# Patient Record
Sex: Female | Born: 1995 | Race: Black or African American | Hispanic: No | Marital: Single | Smoking: Never smoker
Health system: Southern US, Community
[De-identification: ages and names within clinical notes are randomized; demographics above are authoritative.]

## PROBLEM LIST (undated history)

## (undated) ENCOUNTER — Inpatient Hospital Stay (HOSPITAL_COMMUNITY): Payer: Self-pay

## (undated) DIAGNOSIS — R569 Unspecified convulsions: Secondary | ICD-10-CM

## (undated) HISTORY — PX: NO PAST SURGERIES: SHX2092

## (undated) HISTORY — DX: Unspecified convulsions: R56.9

---
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
## 2018-03-19 ENCOUNTER — Encounter

## 2018-05-24 ENCOUNTER — Other Ambulatory Visit: Payer: Self-pay

## 2018-05-24 ENCOUNTER — Encounter (HOSPITAL_COMMUNITY): Payer: Self-pay | Admitting: *Deleted

## 2018-05-24 ENCOUNTER — Inpatient Hospital Stay (HOSPITAL_COMMUNITY)
Admission: AD | Admit: 2018-05-24 | Discharge: 2018-05-24 | Disposition: A | Discharge status: Home / Self Care | Payer: Medicaid Other | Source: Ambulatory Visit | Attending: Obstetrics and Gynecology | Admitting: Obstetrics and Gynecology

## 2018-05-24 ENCOUNTER — Inpatient Hospital Stay (HOSPITAL_BASED_OUTPATIENT_CLINIC_OR_DEPARTMENT_OTHER): Payer: Medicaid Other

## 2018-05-24 DIAGNOSIS — O4402 Placenta previa specified as without hemorrhage, second trimester: Secondary | ICD-10-CM

## 2018-05-24 DIAGNOSIS — Z3686 Encounter for antenatal screening for cervical length: Secondary | ICD-10-CM | POA: Diagnosis not present

## 2018-05-24 DIAGNOSIS — O26892 Other specified pregnancy related conditions, second trimester: Secondary | ICD-10-CM | POA: Insufficient documentation

## 2018-05-24 DIAGNOSIS — Z3A19 19 weeks gestation of pregnancy: Secondary | ICD-10-CM

## 2018-05-24 DIAGNOSIS — O26872 Cervical shortening, second trimester: Secondary | ICD-10-CM

## 2018-05-24 LAB — OB RESULTS CONSOLE HIV ANTIBODY (ROUTINE TESTING): HIV: NONREACTIVE

## 2018-05-24 LAB — OB RESULTS CONSOLE GC/CHLAMYDIA
Chlamydia: NEGATIVE
Gonorrhea: NEGATIVE

## 2018-05-24 LAB — OB RESULTS CONSOLE ABO/RH: RH Type: POSITIVE

## 2018-05-24 LAB — OB RESULTS CONSOLE RPR: RPR: NONREACTIVE

## 2018-05-24 LAB — OB RESULTS CONSOLE ANTIBODY SCREEN: ANTIBODY SCREEN: NEGATIVE

## 2018-05-24 LAB — OB RESULTS CONSOLE RUBELLA ANTIBODY, IGM: Rubella: IMMUNE

## 2018-05-24 LAB — OB RESULTS CONSOLE HEPATITIS B SURFACE ANTIGEN: Hepatitis B Surface Ag: NEGATIVE

## 2018-05-24 NOTE — Discharge Instructions (Signed)
Second Trimester of Pregnancy The second trimester is from week 13 through week 28, month 4 through 6. This is often the time in pregnancy that you feel your best. Often times, morning sickness has lessened or quit. You may have more energy, and you may get hungry more often. Your unborn baby (fetus) is growing rapidly. At the end of the sixth month, he or she is about 9 inches long and weighs about 1 pounds. You will likely feel the baby move (quickening) between 18 and 20 weeks of pregnancy. Follow these instructions at home:  Avoid all smoking, herbs, and alcohol. Avoid drugs not approved by your doctor.  Do not use any tobacco products, including cigarettes, chewing tobacco, and electronic cigarettes. If you need help quitting, ask your doctor. You may get counseling or other support to help you quit.  Only take medicine as told by your doctor. Some medicines are safe and some are not during pregnancy.  Exercise only as told by your doctor. Stop exercising if you start having cramps.  Eat regular, healthy meals.  Wear a good support bra if your breasts are tender.  Do not use hot tubs, steam rooms, or saunas.  Wear your seat belt when driving.  Avoid raw meat, uncooked cheese, and liter boxes and soil used by cats.  Take your prenatal vitamins.  Take 1500-2000 milligrams of calcium daily starting at the 20th week of pregnancy until you deliver your baby.  Try taking medicine that helps you poop (stool softener) as needed, and if your doctor approves. Eat more fiber by eating fresh fruit, vegetables, and whole grains. Drink enough fluids to keep your pee (urine) clear or pale yellow.  Take warm water baths (sitz baths) to soothe pain or discomfort caused by hemorrhoids. Use hemorrhoid cream if your doctor approves.  If you have puffy, bulging veins (varicose veins), wear support hose. Raise (elevate) your feet for 15 minutes, 3-4 times a day. Limit salt in your diet.  Avoid heavy  lifting, wear low heals, and sit up straight.  Rest with your legs raised if you have leg cramps or low back pain.  Visit your dentist if you have not gone during your pregnancy. Use a soft toothbrush to brush your teeth. Be gentle when you floss.  You can have sex (intercourse) unless your doctor tells you not to.  Go to your doctor visits. Get help if:  You feel dizzy.  You have mild cramps or pressure in your lower belly (abdomen).  You have a nagging pain in your belly area.  You continue to feel sick to your stomach (nauseous), throw up (vomit), or have watery poop (diarrhea).  You have bad smelling fluid coming from your vagina.  You have pain with peeing (urination). Get help right away if:  You have a fever.  You are leaking fluid from your vagina.  You have spotting or bleeding from your vagina.  You have severe belly cramping or pain.  You lose or gain weight rapidly.  You have trouble catching your breath and have chest pain.  You notice sudden or extreme puffiness (swelling) of your face, hands, ankles, feet, or legs.  You have not felt the baby move in over an hour.  You have severe headaches that do not go away with medicine.  You have vision changes. This information is not intended to replace advice given to you by your health care provider. Make sure you discuss any questions you have with your health care   provider. Document Released: 11/12/2009 Document Revised: 01/24/2016 Document Reviewed: 10/19/2012 Elsevier Interactive Patient Education  2017 Elsevier Inc.  

## 2018-05-24 NOTE — MAU Note (Signed)
Urine in lab 

## 2018-05-24 NOTE — MAU Note (Signed)
Initial prenatal appt today.  Pt denies pain, feels some pressure.  Denies bleeding or leaking, was told they saw some pink d/c on exam.

## 2018-05-24 NOTE — Progress Notes (Signed)
[facility] to pt arrival.  On US today, cervical funneling was noted and dx with a complete previa.  Pt sent in for further eval.

## 2018-05-24 NOTE — MAU Provider Note (Signed)
  History     CSN: [id number]  Arrival date and time: 05/24/18 1112  Chief Complaint  Patient presents with  . further eval   G1 @19 .4 wks [facility] for possible cervical shortening and funneling. Her pregnancy is also complicated by placenta previa. Denies VB or pain. Has not felt FM yet.  OB History    Gravida  1   Para      Term      Preterm      AB      Living        SAB      TAB      Ectopic      Multiple      Live Births              Past Medical History:  Diagnosis Date  . Medical history non-contributory     Past Surgical History:  Procedure Laterality Date  . NO PAST SURGERIES      No family history on file.  Social History   Tobacco Use  . Smoking status: Never Smoker  . Smokeless tobacco: Never Used  Substance Use Topics  . Alcohol use: No  . Drug use: Never    Allergies: No Known Allergies  Medications Prior to Admission  Medication Sig Dispense Refill Last Dose  . escitalopram (LEXAPRO) 10 MG tablet Take 1 tablet (10 mg total) by mouth daily. 30 tablet 0     Review of Systems  Gastrointestinal: Negative for abdominal pain.  Genitourinary: Negative for vaginal bleeding.   Physical Exam   Blood pressure 109/60, pulse 97, temperature 99 F (37.2 C), temperature source Oral, resp. rate 16.  Physical Exam  Constitutional: She is oriented to person, place, and time. She appears well-developed and well-nourished. No distress.  HENT:  Head: Normocephalic.  Neck: Normal range of motion.  Respiratory: Effort normal. No respiratory distress.  Musculoskeletal: Normal range of motion.  Neurological: She is alert and oriented to person, place, and time.  Psychiatric: She has a normal mood and affect.  FHT 152  MAU Course  Procedures  MDM US ordered and reviewed. Normal placenta and cervical length. Consult with Dr. Earlene Plateravis, images reviewed. Results discussed with pt and partner. Stable for discharge  home.  Assessment and Plan  [redacted] weeks gestation Discharge home [facility] Return precautions  Allergies as of 05/24/2018   No Known Allergies     Medication List    TAKE these medications   escitalopram 10 MG tablet Commonly known as:  LEXAPRO Take 1 tablet (10 mg total) by mouth daily.      Donette LarryMelanie Naylee Frankowski, CNM 05/24/2018, 1:17 PM

## 2018-07-26 ENCOUNTER — Ambulatory Visit (INDEPENDENT_AMBULATORY_CARE_PROVIDER_SITE_OTHER): Payer: Medicaid Other | Admitting: Neurology

## 2018-07-26 ENCOUNTER — Encounter: Payer: Self-pay | Admitting: Neurology

## 2018-07-26 VITALS — BP 115/75 | HR 105 | Ht 61.0 in | Wt 133.0 lb

## 2018-07-26 DIAGNOSIS — R569 Unspecified convulsions: Secondary | ICD-10-CM | POA: Diagnosis not present

## 2018-07-26 NOTE — Progress Notes (Signed)
PATIENT: Jenny Chan DOB: 1996-07-31  Chief Complaint  Patient presents with  . Rule out seizure    She is here with her significant other, Jenny Chan.  She is currently [redacted] weeks pregnant with her first child.  Reports two witnessed events of passing out with body tremors.  Says she was disoriented after both episodes.  Jenny Chan, Jenny Chan (no PCP)     HISTORICAL  Jenny Chan is a 22 year old female, seen in request by her primary care physician Dr. Shawnie Pons, Kenney Houseman for evaluation of seizure, initial evaluation was on July 26, 2018.  She is accompanied by her significant others for the month.  She is currently [redacted] weeks pregnant.  Due date will be on October 14, 2018  I have reviewed and summarized the referring note from the referring physician.  She denies a previous history of seizure, presented with 2 seizure-like event, the first episode was in 2018, the second episode was in September 2019, both episode happened in a similar scenario, she was in a standing position, was in a hot argument, she felt so angry, fell to the ground, no loss of consciousness, but her body began to shake, she described it as pelvic on and off the floor trushing movement, she is aware of the surrounding, but has difficulty talking, lasting for few minutes, followed by mild to moderate bilateral frontal headaches.   REVIEW OF SYSTEMS: Full 14 system review of systems performed and notable only for as above All other review of systems were negative.  ALLERGIES: No Known Allergies  HOME MEDICATIONS: Current Outpatient Medications  Medication Sig Dispense Refill  . cetirizine (ZYRTEC) 10 MG tablet Take 10 mg by mouth daily.    . Prenatal Vit-Fe Fumarate-FA (PREPLUS) 27-1 MG TABS Take 1 tablet by mouth daily.  11   No current facility-administered medications for this visit.     PAST MEDICAL HISTORY: Past Medical History:  Diagnosis Date  . Seizure-like activity (HCC)     PAST  SURGICAL HISTORY: Past Surgical History:  Procedure Laterality Date  . NO PAST SURGERIES      FAMILY HISTORY: Family History  Problem Relation Age of Onset  . Healthy Mother   . Healthy Father     SOCIAL HISTORY: Social History   Socioeconomic History  . Marital status: Single    Spouse name: Not on file  . Number of children: 0  . Years of education: 46  . Highest education level: High school graduate  Occupational History  . Occupation: unemployed  Social Needs  . Financial resource strain: Not on file  . Food insecurity:    Worry: Not on file    Inability: Not on file  . Transportation needs:    Medical: Not on file    Non-medical: Not on file  Tobacco Use  . Smoking status: Never Smoker  . Smokeless tobacco: Never Used  Substance and Sexual Activity  . Alcohol use: No  . Drug use: Never  . Sexual activity: Yes    Birth control/protection: None  Lifestyle  . Physical activity:    Days per week: Not on file    Minutes per session: Not on file  . Stress: Not on file  Relationships  . Social connections:    Talks on phone: Not on file    Gets together: Not on file    Attends religious service: Not on file    Active member of club or organization: Not on file  Attends meetings of clubs or organizations: Not on file    Relationship status: Not on file  . Intimate partner violence:    Fear of current or ex partner: Not on file    Emotionally abused: Not on file    Physically abused: Not on file    Forced sexual activity: Not on file  Other Topics Concern  . Not on file  Social History Narrative   Right-handed.   Occasional caffeine use.   Lives at home with her mother.     PHYSICAL EXAM   Vitals:   07/26/18 1500  BP: 115/75  Pulse: (!) 105  Weight: 133 lb (60.3 kg)  Height: 5\' 1"  (1.549 m)    Not recorded      Body mass index is 25.13 kg/m.  PHYSICAL EXAMNIATION:  Gen: NAD, conversant, well nourised, obese, well groomed                      Cardiovascular: Regular rate rhythm, no peripheral edema, warm, nontender. Eyes: Conjunctivae clear without exudates or hemorrhage Neck: Supple, no carotid bruits. Pulmonary: Clear to auscultation bilaterally   NEUROLOGICAL EXAM:  MENTAL STATUS: Speech:    Speech is normal; fluent and spontaneous with normal comprehension.  Cognition:     Orientation to time, place and person     Normal recent and remote memory     Normal Attention span and concentration     Normal Language, naming, repeating,spontaneous speech     Fund of knowledge   CRANIAL NERVES: CN II: Visual fields are full to confrontation. Fundoscopic exam is normal with sharp discs and no vascular changes. Pupils are round equal and briskly reactive to light. CN III, IV, VI: extraocular movement are normal. No ptosis. CN V: Facial sensation is intact to pinprick in all 3 divisions bilaterally. Corneal responses are intact.  CN VII: Face is symmetric with normal eye closure and smile. CN VIII: Hearing is normal to rubbing fingers CN IX, X: Palate elevates symmetrically. Phonation is normal. CN XI: Head turning and shoulder shrug are intact CN XII: Tongue is midline with normal movements and no atrophy.  MOTOR: There is no pronator drift of out-stretched arms. Muscle bulk and tone are normal. Muscle strength is normal.  REFLEXES: Reflexes are 2+ and symmetric at the biceps, triceps, knees, and ankles. Plantar responses are flexor.  SENSORY: Intact to light touch, pinprick, positional sensation and vibratory sensation are intact in fingers and toes.  COORDINATION: Rapid alternating movements and fine finger movements are intact. There is no dysmetria on finger-to-nose and heel-knee-shin.    GAIT/STANCE: Posture is normal. Gait is steady with normal steps, base, arm swing, and turning. Heel and toe walking are normal. Tandem gait is normal.  Romberg is absent.   DIAGNOSTIC DATA (LABS, IMAGING, TESTING) - I  reviewed patient records, labs, notes, testing and imaging myself where available.   ASSESSMENT AND PLAN  Jenny Chan is a 22 y.o. female   Body shaking without loss of consciousness  The described the body movement is not consistent with seizure activity  Will complete evaluation was EEG  She is currently [redacted] weeks pregnant, due date is on October 14, 2018, she is to call clinic for any recurrent spells, we will hold off imaging study at this point.    Jenny FeinsteinYijun Dieter Chan, M.D. Ph.D.  [facility] [address] [place], [place] [address] Ph: [phone] Fax: [phone]  CC: Reva BoresPratt, Tanya S, Jenny Chan

## 2018-08-26 ENCOUNTER — Other Ambulatory Visit: Payer: Medicaid Other

## 2018-09-01 NOTE — L&D Delivery Note (Signed)
Patient: Jenny Chan MRN: [id number]  GBS status: positive, IAP given: PCN  Patient is a 23 y.o. now G1P1 s/p NSVD at [redacted]w[redacted]d, who was admitted for SOL. AROM 10h 6m prior to delivery with clear fluid.    Delivery Note At 11:10 PM a viable female was delivered via Vaginal, Spontaneous (Presentation: cephalic;  LOA).  APGAR: 8, 9; weight: pending (appears AGA) .   Placenta status: intact, 3-vessel cord.  Cord:  with the following complications: none.    Anesthesia: epidural  Episiotomy: None Lacerations: 2nd degree;Sulcus Suture Repair: 2.0 vicryl Est. Blood Loss (mL): 241  Mom to postpartum.  Baby to Couplet care / Skin to Skin.  Gwenevere Abbot 10/16/2018, 11:31 PM

## 2018-09-21 ENCOUNTER — Ambulatory Visit: Payer: Medicaid Other | Admitting: Neurology

## 2018-09-21 DIAGNOSIS — R569 Unspecified convulsions: Secondary | ICD-10-CM | POA: Diagnosis not present

## 2018-09-21 LAB — OB RESULTS CONSOLE GBS: GBS: POSITIVE

## 2018-09-21 LAB — OB RESULTS CONSOLE GC/CHLAMYDIA
Chlamydia: NEGATIVE
Gonorrhea: NEGATIVE

## 2018-09-24 NOTE — Procedures (Signed)
   HISTORY: 23 years old female, [redacted] weeks pregnant, presented with 2 witnessed episode of passing out with body tremors  TECHNIQUE:  This is a routine 16 channel EEG recording with one channel devoted to a limited EKG recording.  It was performed during wakefulness, drowsiness and asleep.  Hyperventilation and photic stimulation were performed as activating procedures.  There are minimum muscle and movement artifact noted.  Upon maximum arousal, posterior dominant waking rhythm consistent of rhythmic alpha range activity, with frequency of 10 hz. Activities are symmetric over the bilateral posterior derivations and attenuated with eye opening.  Hyperventilation produced mild/moderate buildup with higher amplitude and the slower activities noted.  Photic stimulation did not alter the tracing.  During EEG recording, patient developed drowsiness and entered stage II sleep, sleep EEG demonstrated architecture, there were frontal centrally dominant vertex waves and symmetric sleep spindles noted.  During EEG recording, there was no epileptiform discharge noted.  EKG demonstrate sinus rhythm, with heart rate of 90 bpm  CONCLUSION: This is a  normal awake and sleep EEG.  There is no electrodiagnostic evidence of epileptiform discharge.  Levert Feinstein, M.D. Ph.D.  [facility] [address] [place], [place] [address] Phone: [phone] Fax:      [phone]

## 2018-10-15 ENCOUNTER — Other Ambulatory Visit: Payer: Self-pay | Admitting: Advanced Practice Midwife

## 2018-10-15 ENCOUNTER — Telehealth (HOSPITAL_COMMUNITY): Payer: Self-pay | Admitting: *Deleted

## 2018-10-15 NOTE — Telephone Encounter (Signed)
Preadmission screen  

## 2018-10-16 ENCOUNTER — Other Ambulatory Visit: Payer: Self-pay

## 2018-10-16 ENCOUNTER — Inpatient Hospital Stay (HOSPITAL_COMMUNITY)
Admission: AD | Admit: 2018-10-16 | Discharge: 2018-10-18 | DRG: 807 | Disposition: A | Discharge status: Home / Self Care | Payer: Medicaid Other | Attending: Obstetrics and Gynecology | Admitting: Obstetrics and Gynecology

## 2018-10-16 ENCOUNTER — Encounter (HOSPITAL_COMMUNITY): Payer: Self-pay | Admitting: *Deleted

## 2018-10-16 ENCOUNTER — Encounter (HOSPITAL_COMMUNITY): Payer: Self-pay

## 2018-10-16 ENCOUNTER — Inpatient Hospital Stay (HOSPITAL_COMMUNITY)
Admission: AD | Admit: 2018-10-16 | Discharge: 2018-10-16 | Disposition: A | Discharge status: Home / Self Care | Payer: Medicaid Other | Source: Home / Self Care | Attending: Obstetrics and Gynecology | Admitting: Obstetrics and Gynecology

## 2018-10-16 ENCOUNTER — Inpatient Hospital Stay (HOSPITAL_COMMUNITY): Payer: Medicaid Other | Admitting: Anesthesiology

## 2018-10-16 DIAGNOSIS — O99824 Streptococcus B carrier state complicating childbirth: Secondary | ICD-10-CM | POA: Diagnosis present

## 2018-10-16 DIAGNOSIS — O471 False labor at or after 37 completed weeks of gestation: Secondary | ICD-10-CM

## 2018-10-16 DIAGNOSIS — Z3A40 40 weeks gestation of pregnancy: Secondary | ICD-10-CM

## 2018-10-16 DIAGNOSIS — O480 Post-term pregnancy: Secondary | ICD-10-CM

## 2018-10-16 DIAGNOSIS — O479 False labor, unspecified: Secondary | ICD-10-CM

## 2018-10-16 DIAGNOSIS — Z3A41 41 weeks gestation of pregnancy: Secondary | ICD-10-CM

## 2018-10-16 LAB — CBC
HCT: 39.2 % (ref 36.0–46.0)
HEMOGLOBIN: 12.9 g/dL (ref 12.0–15.0)
MCH: 29.1 pg (ref 26.0–34.0)
MCHC: 32.9 g/dL (ref 30.0–36.0)
MCV: 88.5 fL (ref 80.0–100.0)
Platelets: 224 10*3/uL (ref 150–400)
RBC: 4.43 MIL/uL (ref 3.87–5.11)
RDW: 16.9 % — ABNORMAL HIGH (ref 11.5–15.5)
WBC: 10 10*3/uL (ref 4.0–10.5)
nRBC: 0.4 % — ABNORMAL HIGH (ref 0.0–0.2)

## 2018-10-16 LAB — TYPE AND SCREEN
ABO/RH(D): O POS
Antibody Screen: NEGATIVE

## 2018-10-16 LAB — ABO/RH: ABO/RH(D): O POS

## 2018-10-16 MED ORDER — OXYTOCIN 40 UNITS IN NORMAL SALINE INFUSION - SIMPLE MED
1.0000 m[IU]/min | INTRAVENOUS | Status: DC
  Administered 2018-10-16: 2 m[IU]/min via INTRAVENOUS

## 2018-10-16 MED ORDER — SODIUM CHLORIDE 0.9 % IV SOLN
5.0000 10*6.[IU] | Freq: Once | INTRAVENOUS | Status: AC
  Administered 2018-10-16: 5 10*6.[IU] via INTRAVENOUS
  Filled 2018-10-16: qty 5

## 2018-10-16 MED ORDER — PHENYLEPHRINE 40 MCG/ML (10ML) SYRINGE FOR IV PUSH (FOR BLOOD PRESSURE SUPPORT)
80.0000 ug | PREFILLED_SYRINGE | INTRAVENOUS | Status: DC | PRN
  Filled 2018-10-16: qty 10

## 2018-10-16 MED ORDER — OXYCODONE-ACETAMINOPHEN 5-325 MG PO TABS: 1.0000 | ORAL_TABLET | ORAL | Status: DC | PRN

## 2018-10-16 MED ORDER — TERBUTALINE SULFATE 1 MG/ML IJ SOLN
0.2500 mg | Freq: Once | INTRAMUSCULAR | Status: DC | PRN
  Filled 2018-10-16: qty 1

## 2018-10-16 MED ORDER — ONDANSETRON HCL 4 MG/2ML IJ SOLN: 4.0000 mg | Freq: Four times a day (QID) | INTRAMUSCULAR | Status: DC | PRN

## 2018-10-16 MED ORDER — MISOPROSTOL 25 MCG QUARTER TABLET
25.0000 ug | ORAL_TABLET | ORAL | Status: DC | PRN
  Filled 2018-10-16: qty 1

## 2018-10-16 MED ORDER — LACTATED RINGERS IV SOLN
500.0000 mL | Freq: Once | INTRAVENOUS | Status: AC
  Administered 2018-10-16: 500 mL via INTRAVENOUS

## 2018-10-16 MED ORDER — SOD CITRATE-CITRIC ACID 500-334 MG/5ML PO SOLN: 30.0000 mL | ORAL | Status: DC | PRN

## 2018-10-16 MED ORDER — EPHEDRINE 5 MG/ML INJ
10.0000 mg | INTRAVENOUS | Status: DC | PRN
  Filled 2018-10-16: qty 2

## 2018-10-16 MED ORDER — PHENYLEPHRINE 40 MCG/ML (10ML) SYRINGE FOR IV PUSH (FOR BLOOD PRESSURE SUPPORT)
80.0000 ug | PREFILLED_SYRINGE | INTRAVENOUS | Status: DC | PRN
  Filled 2018-10-16 (×2): qty 10

## 2018-10-16 MED ORDER — OXYTOCIN BOLUS FROM INFUSION
500.0000 mL | Freq: Once | INTRAVENOUS | Status: AC
  Administered 2018-10-16: 500 mL via INTRAVENOUS

## 2018-10-16 MED ORDER — LIDOCAINE HCL (PF) 1 % IJ SOLN
INTRAMUSCULAR | Status: DC | PRN
  Administered 2018-10-16: 13 mL via EPIDURAL

## 2018-10-16 MED ORDER — OXYCODONE-ACETAMINOPHEN 5-325 MG PO TABS: 2.0000 | ORAL_TABLET | ORAL | Status: DC | PRN

## 2018-10-16 MED ORDER — LACTATED RINGERS IV SOLN: 500.0000 mL | INTRAVENOUS | Status: DC | PRN

## 2018-10-16 MED ORDER — OXYTOCIN 40 UNITS IN NORMAL SALINE INFUSION - SIMPLE MED
2.5000 [IU]/h | INTRAVENOUS | Status: DC
  Administered 2018-10-16 (×2): 2.5 [IU]/h via INTRAVENOUS
  Filled 2018-10-16: qty 1000

## 2018-10-16 MED ORDER — ACETAMINOPHEN 325 MG PO TABS: 650.0000 mg | ORAL_TABLET | ORAL | Status: DC | PRN

## 2018-10-16 MED ORDER — DIPHENHYDRAMINE HCL 50 MG/ML IJ SOLN: 12.5000 mg | INTRAMUSCULAR | Status: DC | PRN

## 2018-10-16 MED ORDER — PENICILLIN G 3 MILLION UNITS IVPB - SIMPLE MED
3.0000 10*6.[IU] | INTRAVENOUS | Status: DC
  Administered 2018-10-16 (×3): 3 10*6.[IU] via INTRAVENOUS
  Filled 2018-10-16 (×5): qty 100

## 2018-10-16 MED ORDER — FENTANYL CITRATE (PF) 100 MCG/2ML IJ SOLN: 100.0000 ug | INTRAMUSCULAR | Status: DC | PRN

## 2018-10-16 MED ORDER — FENTANYL 2.5 MCG/ML BUPIVACAINE 1/10 % EPIDURAL INFUSION (WH - ANES)
14.0000 mL/h | INTRAMUSCULAR | Status: DC | PRN
  Administered 2018-10-16 (×2): 14 mL/h via EPIDURAL
  Filled 2018-10-16 (×2): qty 100

## 2018-10-16 MED ORDER — LIDOCAINE HCL (PF) 1 % IJ SOLN
30.0000 mL | INTRAMUSCULAR | Status: DC | PRN
  Filled 2018-10-16: qty 30

## 2018-10-16 MED ORDER — LACTATED RINGERS IV SOLN
INTRAVENOUS | Status: DC
  Administered 2018-10-16 (×2): via INTRAVENOUS

## 2018-10-16 NOTE — Anesthesia Procedure Notes (Signed)
Epidural Patient location during procedure: OB Start time: 10/16/2018 9:44 AM End time: 10/16/2018 10:02 AM  Staffing Anesthesiologist: Lowella Curb, MD Performed: anesthesiologist   Preanesthetic Checklist Completed: patient identified, site marked, surgical consent, pre-op evaluation, timeout performed, IV checked, risks and benefits discussed and monitors and equipment checked  Epidural Patient position: sitting Prep: ChloraPrep Patient monitoring: heart rate, cardiac monitor, continuous pulse ox and blood pressure Approach: midline Location: L2-L3 Injection technique: LOR saline  Needle:  Needle type: Tuohy  Needle gauge: 17 G Needle length: 9 cm Needle insertion depth: 5 cm Catheter type: closed end flexible Catheter size: 20 Guage Catheter at skin depth: 9 cm Test dose: negative  Assessment Events: blood not aspirated, injection not painful, no injection resistance, negative IV test and no paresthesia  Additional Notes Reason for block:procedure for pain

## 2018-10-16 NOTE — Discharge Summary (Signed)
Postpartum Discharge Summary     Patient Name: Jenny Chan DOB: Jun 22, 1996 MRN: [id number]  Date of admission: 10/16/2018 Delivering Provider: Gwenevere Abbot   Date of discharge: 10/18/2018  Admitting diagnosis: CTX Intrauterine pregnancy: [redacted]w[redacted]d     Secondary diagnosis:  Active Problems:   Post term pregnancy at [redacted] weeks gestation   SVD (spontaneous vaginal delivery)  Additional problems: none     Discharge diagnosis: Term Pregnancy Delivered                                                                                                Post partum procedures:none  Augmentation: AROM and Pitocin  Complications: None  Hospital course:  Onset of Labor With Vaginal Delivery     23 y.o. yo G1P0 at [redacted]w[redacted]d was admitted in Active Labor on 10/16/2018. Patient had an uncomplicated labor course as follows:  Membrane Rupture Time/Date: 12:18 PM ,10/16/2018   Intrapartum Procedures: Episiotomy: None [1]                                         Lacerations:  2nd degree [3];Sulcus [9]  Patient had a delivery of a Viable infant. 10/16/2018  Information for the patient's newborn:  Revan, Strojny [[id number]]  Delivery Method: Vag-Spont    Pateint had an uncomplicated postpartum course.  She is ambulating, tolerating a regular diet, passing flatus, and urinating well. Patient is discharged home in stable condition on 10/18/18.   Magnesium Sulfate recieved: No BMZ received: No  Physical exam  Vitals:   10/17/18 1033 10/17/18 1506 10/17/18 2135 10/18/18 0515  BP: 107/72 111/63 (!) 98/44 107/66  Pulse: 85 81 70 70  Resp: 16 16 17 18   Temp: 98.3 F (36.8 C) 98.2 F (36.8 C) 98.3 F (36.8 C) 97.7 F (36.5 C)  TempSrc: Oral Oral Oral   SpO2:   100% 100%  Weight:      Height:       General: alert, cooperative and no distress Lochia: appropriate Uterine Fundus: firm Incision: N/A DVT Evaluation: No evidence of DVT seen on physical exam. Labs: Lab Results  Component  Value Date   WBC 10.0 10/16/2018   HGB 12.9 10/16/2018   HCT 39.2 10/16/2018   MCV 88.5 10/16/2018   PLT 224 10/16/2018   CMP Latest Ref Rng & Units 10/05/2015  Glucose 65 - 99 mg/dL 97  BUN 6 - 20 mg/dL 7  Creatinine 2.68 - 3.41 mg/dL 9.62  Sodium 229 - 798 mmol/L 139  Potassium 3.5 - 5.1 mmol/L 5.0  Chloride 101 - 111 mmol/L 103  CO2 22 - 32 mmol/L 24  Calcium 8.9 - 10.3 mg/dL 92.1  Total Protein 6.5 - 8.1 g/dL 1.9(E)  Total Bilirubin 0.3 - 1.2 mg/dL 1.7(E)  Alkaline Phos 47 - 119 U/L 53  AST 15 - 41 U/L 35  ALT 14 - 54 U/L 18    Discharge instruction: per After Visit Summary and "Baby and Me Booklet".  After visit meds:  Allergies as  of 10/18/2018   No Known Allergies     Medication List    TAKE these medications   calcium carbonate 500 MG chewable tablet Commonly known as:  TUMS - dosed in mg elemental calcium Chew 2 tablets by mouth daily as needed for indigestion or heartburn.   prenatal multivitamin Tabs tablet Take 1 tablet by mouth daily at 12 noon.       Diet: routine diet  Activity: Advance as tolerated. Pelvic rest for 6 weeks.   Outpatient follow up:6 weeks Follow up Appt:No future appointments. Follow up Visit: Follow-up Information    Department, [facility] Schedule an appointment as soon as possible for a visit in 6 week(s).   Why:  Make appointment to be seen in 6 weeks for postpartum appointment  Contact information: [address] [place] [address] [phone]          Newborn Data: Live born female  Birth Weight:   APGAR: 8, 9  Newborn Delivery   Birth date/time:  10/16/2018 23:10:00 Delivery type:  Vaginal, Spontaneous     Baby Feeding: Breast Disposition:home with mother   10/18/2018 Sharyon Cable, CNM

## 2018-10-16 NOTE — MAU Note (Signed)
I have communicated with Dr. Nicki Reaper and reviewed vital signs:  Vitals:   10/16/18 0246 10/16/18 0410  BP: 119/68 108/67  Pulse: 87 82  Resp: 19   Temp: (!) 97.5 F (36.4 C)     Vaginal exam:  Dilation: 2 Effacement (%): 80 Station: -2 Presentation: Vertex Exam by:: Latricia Heft, RN ,   Also reviewed contraction pattern and that non-stress test is reactive.  It has been documented that patient is contracting every 3-8 minutes with minimal cervical change over 1.5 hours not indicating active labor.  Patient denies any other complaints.  Based on this report provider has given order for discharge.  A discharge order and diagnosis entered by a provider.   Labor discharge instructions reviewed with patient.

## 2018-10-16 NOTE — Discharge Instructions (Signed)
Braxton Hicks Contractions Contractions of the uterus can occur throughout pregnancy, but they are not always a sign that you are in labor. You may have practice contractions called Braxton Hicks contractions. These false labor contractions are sometimes confused with true labor. What are Braxton Hicks contractions? Braxton Hicks contractions are tightening movements that occur in the muscles of the uterus before labor. Unlike true labor contractions, these contractions do not result in opening (dilation) and thinning of the cervix. Toward the end of pregnancy (32-34 weeks), Braxton Hicks contractions can happen more often and may become stronger. These contractions are sometimes difficult to tell apart from true labor because they can be very uncomfortable. You should not feel embarrassed if you go to the hospital with false labor. Sometimes, the only way to tell if you are in true labor is for your health care provider to look for changes in the cervix. The health care provider will do a physical exam and may monitor your contractions. If you are not in true labor, the exam should show that your cervix is not dilating and your water has not broken. If there are no other health problems associated with your pregnancy, it is completely safe for you to be sent home with false labor. You may continue to have Braxton Hicks contractions until you go into true labor. How to tell the difference between true labor and false labor True labor  Contractions last 30-70 seconds.  Contractions become very regular.  Discomfort is usually felt in the top of the uterus, and it spreads to the lower abdomen and low back.  Contractions do not go away with walking.  Contractions usually become more intense and increase in frequency.  The cervix dilates and gets thinner. False labor  Contractions are usually shorter and not as strong as true labor contractions.  Contractions are usually irregular.  Contractions  are often felt in the front of the lower abdomen and in the groin.  Contractions may go away when you walk around or change positions while lying down.  Contractions get weaker and are shorter-lasting as time goes on.  The cervix usually does not dilate or become thin. Follow these instructions at home:   Take over-the-counter and prescription medicines only as told by your health care provider.  Keep up with your usual exercises and follow other instructions from your health care provider.  Eat and drink lightly if you think you are going into labor.  If Braxton Hicks contractions are making you uncomfortable: ? Change your position from lying down or resting to walking, or change from walking to resting. ? Sit and rest in a tub of warm water. ? Drink enough fluid to keep your urine pale yellow. Dehydration may cause these contractions. ? Do slow and deep breathing several times an hour.  Keep all follow-up prenatal visits as told by your health care provider. This is important. Contact a health care provider if:  You have a fever.  You have continuous pain in your abdomen. Get help right away if:  Your contractions become stronger, more regular, and closer together.  You have fluid leaking or gushing from your vagina.  You pass blood-tinged mucus (bloody show).  You have bleeding from your vagina.  You have low back pain that you never had before.  You feel your baby's head pushing down and causing pelvic pressure.  Your baby is not moving inside you as much as it used to. Summary  Contractions that occur before labor are   called Braxton Hicks contractions, false labor, or practice contractions.  Braxton Hicks contractions are usually shorter, weaker, farther apart, and less regular than true labor contractions. True labor contractions usually become progressively stronger and regular, and they become more frequent.  Manage discomfort from Braxton Hicks contractions  by changing position, resting in a warm bath, drinking plenty of water, or practicing deep breathing. This information is not intended to replace advice given to you by your health care provider. Make sure you discuss any questions you have with your health care provider. Document Released: 01/01/2017 Document Revised: 06/02/2017 Document Reviewed: 01/01/2017 Elsevier Interactive Patient Education  2019 Elsevier Inc.  

## 2018-10-16 NOTE — MAU Note (Addendum)
Jenny Chan is a 23 y.o. at [redacted]w[redacted]d here in MAU reporting:  +contractions. Reports they have increased since being discharged. Was 2cm at last VE during the night. Denies LOF.  +FM Lab orders placed from triage: mau labor triage

## 2018-10-16 NOTE — H&P (Addendum)
LABOR AND DELIVERY ADMISSION HISTORY AND PHYSICAL NOTE  Jenny Chan is a 23 y.o. female G1P0 with IUP at [redacted]w[redacted]d by LMP presenting for SOL.  She reports positive fetal movement. She denies leakage of fluid or vaginal bleeding.  Prenatal History/Complications: PNC at HD Pregnancy complications:  - Varicella non-immune - not tested for Hgb electrophoresis - GBS Positive  Past Medical History: Past Medical History:  Diagnosis Date  . Seizure-like activity (HCC)   EEG was negative. Had one episode before pregnancy and one episode during around 3 months.  Past Surgical History: Past Surgical History:  Procedure Laterality Date  . NO PAST SURGERIES     Obstetrical History: OB History    Gravida  1   Para      Term      Preterm      AB      Living        SAB      TAB      Ectopic      Multiple      Live Births             Social History: Social History   Socioeconomic History  . Marital status: Single    Spouse name: Not on file  . Number of children: 0  . Years of education: 88  . Highest education level: High school graduate  Occupational History  . Occupation: unemployed  Social Needs  . Financial resource strain: Not on file  . Food insecurity:    Worry: Not on file    Inability: Not on file  . Transportation needs:    Medical: Not on file    Non-medical: Not on file  Tobacco Use  . Smoking status: Never Smoker  . Smokeless tobacco: Never Used  Substance and Sexual Activity  . Alcohol use: No  . Drug use: Never  . Sexual activity: Yes    Birth control/protection: None  Lifestyle  . Physical activity:    Days per week: Not on file    Minutes per session: Not on file  . Stress: Not on file  Relationships  . Social connections:    Talks on phone: Not on file    Gets together: Not on file    Attends religious service: Not on file    Active member of club or organization: Not on file    Attends meetings of clubs or organizations: Not  on file    Relationship status: Not on file  Other Topics Concern  . Not on file  Social History Narrative   Right-handed.   Occasional caffeine use.   Lives at home with her mother.    Family History: Family History  Problem Relation Age of Onset  . Healthy Mother   . Healthy Father    Allergies: No Known Allergies - NKDA  Medications Prior to Admission  Medication Sig Dispense Refill Last Dose  . cetirizine (ZYRTEC) 10 MG tablet Take 10 mg by mouth daily.   Unknown at Unknown time  . Prenatal Vit-Fe Fumarate-FA (PREPLUS) 27-1 MG TABS Take 1 tablet by mouth daily.  11 10/15/2018 at Unknown time   Review of Systems  All systems reviewed and negative except as stated in HPI  Physical Exam Blood pressure 115/67, pulse 85, temperature 97.6 F (36.4 C), temperature source Oral, resp. rate 20. General appearance: alert, oriented, NAD Lungs: normal respiratory effort Heart: regular rate Abdomen: soft, non-tender; gravid, FH appropriate for GA Extremities: No calf swelling or tenderness Presentation:  cephalic Fetal monitoring: baseline 140, moderate variability, 15x15 accels, no decels Uterine activity: contractions q4-5 minutes Dilation: 4 Effacement (%): 90 Station: -1 Exam by:: n druebbisch rn  Prenatal labs: ABO, Rh: O/Positive/-- (09/23 0000) Antibody: Negative (09/23 0000) Rubella: Immune (09/23 0000) RPR: Nonreactive (09/23 0000)  HBsAg: Negative (09/23 0000)  HIV: Non-reactive (09/23 0000)  GC/Chlamydia: Negative/Negative (09/21/2018) GBS: Positive (01/21 0000)  1-hr GTT: 76 wnl Genetic screening: CF, QUAD screen and MSAFP negative (05/24/2018) Anatomy US: WNL, girl  Prenatal Transfer Tool  Maternal Diabetes: No Genetic Screening: Normal Maternal Ultrasounds/Referrals: Normal Fetal Ultrasounds or other Referrals:  None Maternal Substance Abuse:  No Significant Maternal Medications:  Meds include: Other: Prenatal Vitamin Significant Maternal Lab Results:  Lab values include: Group B Strep positive  No results found for this or any previous visit (from the past 24 hour(s)).  Patient Active Problem List   Diagnosis Date Noted  . Post term pregnancy at [redacted] weeks gestation 10/16/2018  . Observed seizure-like activity (HCC) 07/26/2018  . Severe episode of recurrent major depressive disorder, without psychotic features (HCC)     Assessment: Jenny Chan is a 23 y.o. G1P0 at [redacted]w[redacted]d here for SOL.  #Labor: Active labor, plan for AROM #Pain: Epidural in place, 0/10 pain at this time #FWB: Category 1 #ID:  GB positive, starting PCN #MOF: Breast #MOC:Condoms #Circ:  NA, female  Dollene Cleveland 10/16/2018, 9:20 AM  OB FELLOW HISTORY AND PHYSICAL ATTESTATION  I have seen and examined this patient; I agree with above documentation in the resident's note.   Marcy Siren, D.O. OB Fellow  10/16/2018, 11:32 AM

## 2018-10-16 NOTE — Anesthesia Preprocedure Evaluation (Signed)

## 2018-10-16 NOTE — Progress Notes (Signed)
LABOR PROGRESS NOTE  Jenny Chan is a 23 y.o. G1P0 at [redacted]w[redacted]d admitted for SOL.  Subjective: Patient lying in bed, resting. No complaints or concerns at this time.  Objective: BP 108/70   Pulse 89   Temp 98.8 F (37.1 C) (Oral)   Resp 16   Ht 5\' 1"  (1.549 m)   Wt 64.9 kg   SpO2 98%   BMI 27.03 kg/m  or  Vitals:   10/16/18 1430 10/16/18 1500 10/16/18 1530 10/16/18 1600  BP: (!) 103/56 110/67 109/72 108/70  Pulse: 78 91 92 89  Resp: 16 16 16 16   Temp:    98.8 F (37.1 C)  TempSrc:    Oral  SpO2:      Weight:      Height:       Dilation: 9 Effacement (%): 90, 100 Cervical Position: Middle Station: 0 Presentation: Vertex Exam by:: Dr. Dareen Piano  FHT: baseline rate 130, moderate varibility, 15x15 accel, no decel  Labs: Lab Results  Component Value Date   WBC 10.0 10/16/2018   HGB 12.9 10/16/2018   HCT 39.2 10/16/2018   MCV 88.5 10/16/2018   PLT 224 10/16/2018   Patient Active Problem List   Diagnosis Date Noted  . Post term pregnancy at [redacted] weeks gestation 10/16/2018  . Observed seizure-like activity (HCC) 07/26/2018  . Severe episode of recurrent major depressive disorder, without psychotic features (HCC)    Assessment / Plan: 23 y.o. G1P0 at [redacted]w[redacted]d here for SOL.  Labor: Active, AROMed 1218, progressing well, no augmentation at this time Fetal Wellbeing:  Category 1 Pain Control:  Epidural, well controlled Anticipated MOD:  SVD   Peggyann Shoals, DO [facility], PGY-1 10/16/2018 4:19 PM

## 2018-10-16 NOTE — Anesthesia Pain Management Evaluation Note (Signed)
  CRNA Pain Management Visit Note  Patient: Jenny Chan, 24 y.o., female  "Hello I am a member of the anesthesia team at [facility]. We have an anesthesia team available at all times to provide care throughout the hospital, including epidural management and anesthesia for C-section. I don't know your plan for the delivery whether it a natural birth, water birth, IV sedation, nitrous supplementation, doula or epidural, but we want to meet your pain goals."   1.Was your pain managed to your expectations on prior hospitalizations?   No prior hospitalizations  2.What is your expectation for pain management during this hospitalization?     Epidural  3.How can we help you reach that goal? Epidural in place  Record the patient's initial score and the patient's pain goal.   Pain: 1  Pain Goal: 6 [facility] wants you to be able to say your pain was always managed very well.  [name] 10/16/2018

## 2018-10-16 NOTE — MAU Note (Signed)
Pt presents to MAU c/o ctx every . No bleeding or LOF. +FM.

## 2018-10-17 ENCOUNTER — Encounter (HOSPITAL_COMMUNITY): Payer: Self-pay

## 2018-10-17 LAB — RPR: RPR: NONREACTIVE

## 2018-10-17 MED ORDER — TETANUS-DIPHTH-ACELL PERTUSSIS 5-2.5-18.5 LF-MCG/0.5 IM SUSP: 0.5000 mL | Freq: Once | INTRAMUSCULAR | Status: DC

## 2018-10-17 MED ORDER — BENZOCAINE-MENTHOL 20-0.5 % EX AERO
1.0000 "application " | INHALATION_SPRAY | CUTANEOUS | Status: DC | PRN
  Administered 2018-10-17: 1 via TOPICAL
  Filled 2018-10-17: qty 56

## 2018-10-17 MED ORDER — MEASLES, MUMPS & RUBELLA VAC IJ SOLR
0.5000 mL | Freq: Once | INTRAMUSCULAR | Status: DC
  Filled 2018-10-17: qty 0.5

## 2018-10-17 MED ORDER — WITCH HAZEL-GLYCERIN EX PADS
1.0000 "application " | MEDICATED_PAD | CUTANEOUS | Status: DC | PRN
  Administered 2018-10-17: 1 via TOPICAL

## 2018-10-17 MED ORDER — DIBUCAINE 1 % RE OINT
1.0000 "application " | TOPICAL_OINTMENT | RECTAL | Status: DC | PRN
  Administered 2018-10-17: 1 via RECTAL
  Filled 2018-10-17: qty 28

## 2018-10-17 MED ORDER — ACETAMINOPHEN 325 MG PO TABS
650.0000 mg | ORAL_TABLET | ORAL | Status: DC | PRN
  Administered 2018-10-17 (×2): 650 mg via ORAL
  Filled 2018-10-17: qty 2

## 2018-10-17 MED ORDER — COCONUT OIL OIL: 1.0000 "application " | TOPICAL_OIL | Status: DC | PRN

## 2018-10-17 MED ORDER — ONDANSETRON HCL 4 MG PO TABS: 4.0000 mg | ORAL_TABLET | ORAL | Status: DC | PRN

## 2018-10-17 MED ORDER — SENNOSIDES-DOCUSATE SODIUM 8.6-50 MG PO TABS
2.0000 | ORAL_TABLET | ORAL | Status: DC
  Administered 2018-10-17: 2 via ORAL
  Filled 2018-10-17: qty 2

## 2018-10-17 MED ORDER — SIMETHICONE 80 MG PO CHEW: 80.0000 mg | CHEWABLE_TABLET | ORAL | Status: DC | PRN

## 2018-10-17 MED ORDER — IBUPROFEN 600 MG PO TABS
600.0000 mg | ORAL_TABLET | Freq: Four times a day (QID) | ORAL | Status: DC
  Administered 2018-10-17 – 2018-10-18 (×5): 600 mg via ORAL
  Filled 2018-10-17 (×5): qty 1

## 2018-10-17 MED ORDER — PRENATAL MULTIVITAMIN CH
1.0000 | ORAL_TABLET | Freq: Every day | ORAL | Status: DC
  Administered 2018-10-17: 1 via ORAL
  Filled 2018-10-17: qty 1

## 2018-10-17 MED ORDER — DIPHENHYDRAMINE HCL 25 MG PO CAPS: 25.0000 mg | ORAL_CAPSULE | Freq: Four times a day (QID) | ORAL | Status: DC | PRN

## 2018-10-17 MED ORDER — ONDANSETRON HCL 4 MG/2ML IJ SOLN: 4.0000 mg | INTRAMUSCULAR | Status: DC | PRN

## 2018-10-17 NOTE — Progress Notes (Signed)
CSW received consult for Jenny Chan due to history of depression, bipolar, and seizure like activity. CSW met with Jenny Chan, baby Rodena Piety, and MGM Catina Shoffner at bedside to complete assessment. CSW obtained permission from Jenny Chan to discuss concerns with MGM present. Jenny Chan was smiling and happy during assessment and MGM was holding and talking to baby throughout interaction. Jenny Chan reports stability throughout pregnancy regarding her mood. Jenny Chan is not on any psychotropic medications at this time and ceased Lexapro in 2017. Jenny Chan confirmed suicide attempt in 2017 during her junior year of high school. Jenny Chan denies any reoccurring thoughts or feelings of self harm. Jenny Chan denies any active suicidal or homicidal ideations. Jenny Chan's Edinburgh score was a 1. Jenny Chan reports that FOB is involved, but did not state his name. Jenny Chan reports having a great family support system. Jenny Chan reports living with her mother. Jenny Chan is unemployed, receives [facility]. Jenny Chan received prenatal care at [facility] and intends to use condoms as her contraceptive method. CSW discussed adding infant to existing Medicaid and [facility] files. CSW educated Jenny Chan on baby blues period versus postpartum depression, Jenny Chan stated understanding. MGM agreeable to assist in periodically assessing Jenny Chan's mood and stressors in attempt to eliminate any postpartum depression. Jenny Chan reports having all necessary baby items at home to care for newborn. Jenny Chan is breastfeeding at this time. Jenny Chan did not have any questions or concerns, CSW encouraged Jenny Chan to reach out for assistance if any needs arise.  Madilyn Fireman, MSW, [place] Social Worker [facility] [phone]

## 2018-10-17 NOTE — Progress Notes (Signed)
CSW attempted to meet with MOB at bedside, however upon arrival MOB was sleep and FOB was doing skin to skin with newborn in chair. CSW will return at a later time to complete assessment.  CSW spoke with Hulda Humphrey, RN to discuss any identified concerns. RN stated that MOB had a strong family support system and FOB has been engaged in care throughout hospital stay. RN did not identify any negative concerns regarding MOB or FOB.   Edwin Dada, MSW, LCSW-A Clinical Social Worker [facility] [phone]

## 2018-10-17 NOTE — Progress Notes (Signed)
Post Partum Day 1 Subjective: No complaints, up ad lib, voiding, tolerating PO and + flatus  Baby is stable, at bedside. Breastfeeding, moderate lochia.  Objective: Blood pressure 96/63, pulse 84, temperature 98 F (36.7 C), temperature source Oral, resp. rate 18, height 5\' 1"  (1.549 m), weight 64.9 kg, SpO2 98 %, unknown if currently breastfeeding.  Physical Exam:  General: alert and no distress Lochia: appropriate Uterine Fundus: firm DVT Evaluation: No evidence of DVT seen on physical exam. Negative Homan's sign. No cords or calf tenderness. No significant calf/ankle edema.  Recent Labs    10/16/18 0915  HGB 12.9  HCT 39.2    Assessment/Plan: Plan for discharge tomorrow, Breastfeeding and Contraception Condoms  Routine postpartum care   LOS: 1 day   Jaynie Collins, MD 10/17/2018, 8:33 AM

## 2018-10-17 NOTE — Anesthesia Postprocedure Evaluation (Signed)
Anesthesia Post Note  Patient: Sales promotion account executive  Procedure(s) Performed: AN AD HOC LABOR EPIDURAL     Patient location during evaluation: Mother Baby Anesthesia Type: Epidural Level of consciousness: awake and alert and oriented Pain management: satisfactory to patient Vital Signs Assessment: post-procedure vital signs reviewed and stable Respiratory status: spontaneous breathing and nonlabored ventilation Cardiovascular status: stable Postop Assessment: no headache, no backache, no signs of nausea or vomiting, adequate PO intake, patient able to bend at knees and able to ambulate (patient up walking) Anesthetic complications: no    Last Vitals:  Vitals:   10/17/18 0245 10/17/18 0628  BP: (!) 100/52 96/63  Pulse: 74 84  Resp: 18 18  Temp: 37.1 C 36.7 C  SpO2: 99% 98%    Last Pain:  Vitals:   10/17/18 0628  TempSrc: Oral  PainSc: 0-No pain   Pain Goal:                   Madison Hickman

## 2018-10-17 NOTE — Lactation Note (Signed)
This note was copied from a baby's chart. Lactation Consultation Note  Patient Name: Jenny Chan Today's Date: 10/17/2018 Reason for consult: Initial assessment;Term;Primapara;1st time breastfeeding  15 hours old FT female who is being exclusively BF by her mother, she's a P1. Mom took BF classes at the [facility] and she's already familiar with hand expression. Mom doing hand expression when entering the room, when [facility] assisted her noticed that her breast were large, soft and her tissue very compressible. Several big drops of colostrum were easily expressed and finger fed to baby during the process while GOB was changing her diaper. Documented in Flowsheets. Mom has a DEBP at home.  LC took baby to mom's right breast in football position and she was able to latch almost right away with a big wide areolar latch. LC also did some breast compressions and a few audible swallows were noted. GOB and mom very pleased, baby still nursing when exiting the room. Discussed tips for a deep latch, normal newborn behavior, feeding cues and cluster feeding.  Feeding plan:  1. Encouraged mom to feed baby STS 8-12 times/24 hours or sooner if feeding cues are present 2. Hand expression and finger/spoon feeding was also encouraged. LC left an extra spoon for mom to use  BF brochure, BF resources and feeding diary were reviewed. Mom reported all questions and concerns were answered, she's aware of LC services and will call PRN.  Maternal Data Formula Feeding for Exclusion: No Has patient been taught Hand Expression?: Yes Does the patient have breastfeeding experience prior to this delivery?: No  Feeding Feeding Type: Breast Fed  LATCH Score Latch: Grasps breast easily, tongue down, lips flanged, rhythmical sucking.  Audible Swallowing: A few with stimulation  Type of Nipple: Everted at rest and after stimulation  Comfort (Breast/Nipple): Soft / non-tender  Hold (Positioning):  Assistance needed to correctly position infant at breast and maintain latch.  LATCH Score: 8  Interventions Interventions: Breast feeding basics reviewed;Assisted with latch;Skin to skin;Breast massage;Hand express;Breast compression;Adjust position;Support pillows  Lactation Tools Discussed/Used WIC Program: Yes   Consult Status Consult Status: Follow-up Date: 10/18/18 Follow-up type: In-patient    Latricia Cerrito Venetia Constable 10/17/2018, 2:37 PM

## 2018-10-18 NOTE — Lactation Note (Signed)
This note was copied from a baby's chart. Lactation Consultation Note  Patient Name: Jenny Chan Date: 10/18/2018 Reason for consult: Follow-up assessment;Term;Primapara;1st time breastfeeding;Infant weight loss(7% weight loss )  Baby is 35 hours old As LC entered the room mom resting in bed holding baby and waiting for her ride for home.  Per mom baby has eaten recently. Nipples are are alittle sensitive and feeling fuller.  LC reviewed basics of when signs mature milk is coming in.  LC instructed mom on the use of hand pump and shells.  Sore nipple and engorgement prevention and tx. -  LC stressed the importance of STS feedings until the baby can stay awake for majority of feeding / back to birth weight  And gaining well.  Discussed nutritive vs non - nutritive feeding patterns and the importance of watching baby for hanging out latched.  Mother informed of post-discharge support and given phone number to the lactation department, including services for phone call assistance; out-patient appointments; and breastfeeding support group. List of other breastfeeding resources in the community given in the handout. Encouraged mother to call for problems or concerns related to breastfeeding.   Maternal Data Has patient been taught Hand Expression?: Yes(per mom feels comfortable )  Feeding Feeding Type: Breast Fed  LATCH Score                   Interventions Interventions: Breast feeding basics reviewed;Hand pump  Lactation Tools Discussed/Used Tools: Pump Breast pump type: Manual Pump Review: Setup, frequency, and cleaning;Milk Storage Initiated by:: MAI  Date initiated:: 10/18/18   Consult Status Consult Status: Complete Date: 10/18/18    Jenny Chan 10/18/2018, 11:05 AM

## 2018-10-20 ENCOUNTER — Inpatient Hospital Stay (HOSPITAL_COMMUNITY): Admission: RE | Admit: 2018-10-20 | Payer: Medicaid Other | Source: Ambulatory Visit

## 2019-03-21 ENCOUNTER — Other Ambulatory Visit: Payer: Self-pay

## 2019-03-21 ENCOUNTER — Ambulatory Visit (HOSPITAL_COMMUNITY)
Admission: EM | Admit: 2019-03-21 | Discharge: 2019-03-21 | Disposition: A | Discharge status: Home / Self Care | Payer: Medicaid Other | Attending: Family Medicine | Admitting: Family Medicine

## 2019-03-21 ENCOUNTER — Encounter (HOSPITAL_COMMUNITY): Payer: Self-pay | Admitting: Emergency Medicine

## 2019-03-21 DIAGNOSIS — Z113 Encounter for screening for infections with a predominantly sexual mode of transmission: Secondary | ICD-10-CM | POA: Diagnosis not present

## 2019-03-21 DIAGNOSIS — N3090 Cystitis, unspecified without hematuria: Secondary | ICD-10-CM

## 2019-03-21 LAB — POCT URINALYSIS DIP (DEVICE)
Bilirubin Urine: NEGATIVE
Glucose, UA: NEGATIVE mg/dL
Ketones, ur: NEGATIVE mg/dL
Nitrite: NEGATIVE
Protein, ur: 30 mg/dL — AB
Specific Gravity, Urine: 1.02 (ref 1.005–1.030)
Urobilinogen, UA: 0.2 mg/dL (ref 0.0–1.0)
pH: 7.5 (ref 5.0–8.0)

## 2019-03-21 MED ORDER — CEPHALEXIN 500 MG PO CAPS: 500.0000 mg | ORAL_CAPSULE | Freq: Two times a day (BID) | ORAL | 0 refills | Status: DC

## 2019-03-21 NOTE — ED Provider Notes (Signed)
[facility]   [id number] 03/21/19 Arrival Time: 1000  ASSESSMENT & PLAN:  1. Screening for STDs (sexually transmitted diseases)   2. Cystitis     No s/s of pyelonephritis or PID. Declines empiric gonorrhea/chlamydia treatment.  Meds ordered this encounter  Medications  . cephALEXin (KEFLEX) 500 MG capsule    Sig: Take 1 capsule (500 mg total) by mouth 2 (two) times daily.    Dispense:  10 capsule    Refill:  0     Discharge Instructions     We have sent testing for sexually transmitted infections. We will notify you of any positive results once they are received. If required, we will prescribe any medications you might need.  Please refrain from all sexual activity for at least the next seven days.      Pending: Labs Reviewed  POCT URINALYSIS DIP (DEVICE) - Abnormal; Notable for the following components:      Result Value   Hgb urine dipstick TRACE (*)    Protein, ur 30 (*)    Leukocytes,Ua SMALL (*)    All other components within normal limits  URINE CULTURE  RPR  HIV ANTIBODY (ROUTINE TESTING W REFLEX)  CERVICOVAGINAL ANCILLARY ONLY    Will notify of any positive STI or urine culture results. Instructed to refrain from sexual activity for at least seven days.  Reviewed expectations re: course of current medical issues. Questions answered. Outlined signs and symptoms indicating need for more acute intervention. Patient verbalized understanding. After Visit Summary given.   SUBJECTIVE:  Jenny Chan is a 23 y.o. female who presents with complaint of on/off mild dysuria. Mild urinary frequency. Onset gradual. First noticed between one and two weeks ago. No vaginal discharge reported. Does request STI testing. Afebrile. No abdominal or pelvic pain. Normal PO intake without n/v. No rashes or lesions. No frequent thirst. Sexually active with single female partner. OTC treatment: none reported.  ROS: As per HPI. All other systems negative.    OBJECTIVE:  Vitals:   03/21/19 1104  BP: 101/69  Pulse: 72  Resp: 18  Temp: 98.4 F (36.9 C)  TempSrc: Oral  SpO2: 99%     General appearance: alert, cooperative, appears stated age and no distress Throat: lips, mucosa, and tongue normal; teeth and gums normal CV: RRR Lungs: CTAB Back: no CVA tenderness; FROM at waist Abdomen: soft, non-tender GU: deferred Skin: warm and dry Psychological: alert and cooperative; normal mood and affect.  Results for orders placed or performed during the hospital encounter of 03/21/19  POCT urinalysis dip (device)  Result Value Ref Range   Glucose, UA NEGATIVE NEGATIVE mg/dL   Bilirubin Urine NEGATIVE NEGATIVE   Ketones, ur NEGATIVE NEGATIVE mg/dL   Specific Gravity, Urine 1.020 1.005 - 1.030   Hgb urine dipstick TRACE (A) NEGATIVE   pH 7.5 5.0 - 8.0   Protein, ur 30 (A) NEGATIVE mg/dL   Urobilinogen, UA 0.2 0.0 - 1.0 mg/dL   Nitrite NEGATIVE NEGATIVE   Leukocytes,Ua SMALL (A) NEGATIVE    Labs Reviewed  CERVICOVAGINAL ANCILLARY ONLY    No Known Allergies  Past Medical History:  Diagnosis Date  . Seizure-like activity (HCC)    Family History  Problem Relation Age of Onset  . Healthy Mother   . Healthy Father    Social History   Socioeconomic History  . Marital status: Single    Spouse name: Not on file  . Number of children: 0  . Years of education: 412  . Highest  education level: High school graduate  Occupational History  . Occupation: unemployed  Social Needs  . Financial resource strain: Not on file  . Food insecurity    Worry: Not on file    Inability: Not on file  . Transportation needs    Medical: Not on file    Non-medical: Not on file  Tobacco Use  . Smoking status: Never Smoker  . Smokeless tobacco: Never Used  Substance and Sexual Activity  . Alcohol use: No  . Drug use: Never  . Sexual activity: Yes    Birth control/protection: None  Lifestyle  . Physical activity    Days per week: Not on file     Minutes per session: Not on file  . Stress: Not on file  Relationships  . Social Herbalist on phone: Not on file    Gets together: Not on file    Attends religious service: Not on file    Active member of club or organization: Not on file    Attends meetings of clubs or organizations: Not on file    Relationship status: Not on file  . Intimate partner violence    Fear of current or ex partner: Not on file    Emotionally abused: Not on file    Physically abused: Not on file    Forced sexual activity: Not on file  Other Topics Concern  . Not on file  Social History Narrative   Right-handed.   Occasional caffeine use.   Lives at home with her mother.          Vanessa Kick, MD 03/21/19 [phone]

## 2019-03-21 NOTE — Discharge Instructions (Signed)
We have sent testing for sexually transmitted infections. We will notify you of any positive results once they are received. If required, we will prescribe any medications you might need.  Please refrain from all sexual activity for at least the next seven days.  

## 2019-03-21 NOTE — ED Triage Notes (Signed)
Pt here for STD testing and also wants to be checked for UTI

## 2019-03-22 LAB — HIV ANTIBODY (ROUTINE TESTING W REFLEX): HIV Screen 4th Generation wRfx: NONREACTIVE

## 2019-03-22 LAB — RPR: RPR Ser Ql: NONREACTIVE

## 2019-03-23 ENCOUNTER — Telehealth: Payer: Self-pay | Admitting: Emergency Medicine

## 2019-03-23 LAB — URINE CULTURE: Culture: >=100000 — AB

## 2019-03-23 NOTE — Telephone Encounter (Signed)
Treated with Keflex, will wait contacting pt until pending Cervical swab.

## 2019-03-24 ENCOUNTER — Telehealth (HOSPITAL_COMMUNITY): Payer: Self-pay | Admitting: Emergency Medicine

## 2019-03-24 ENCOUNTER — Encounter (HOSPITAL_COMMUNITY): Payer: Self-pay

## 2019-03-24 LAB — CERVICOVAGINAL ANCILLARY ONLY
Bacterial vaginitis: POSITIVE — AB
Candida vaginitis: NEGATIVE
Chlamydia: NEGATIVE
Neisseria Gonorrhea: NEGATIVE
Trichomonas: NEGATIVE

## 2019-03-24 MED ORDER — METRONIDAZOLE 500 MG PO TABS: 500.0000 mg | ORAL_TABLET | Freq: Two times a day (BID) | ORAL | 0 refills | Status: AC

## 2019-03-24 NOTE — Telephone Encounter (Signed)
Bacterial vaginosis is positive. This was not treated at the urgent care visit.  Flagyl 500 mg BID x 7 days #14 no refills sent to patients pharmacy of choice.    Attempted to reach patient. No answer at this time. Voicemail left.    

## 2019-08-14 IMAGING — US US MFM OB LIMITED
1 series · 15 of 28 positions shown · non-contrast
Comparison: none

[Series 1: us mfm ob limited · 33 acquisitions, 15 frames shown]
[im 1/33]
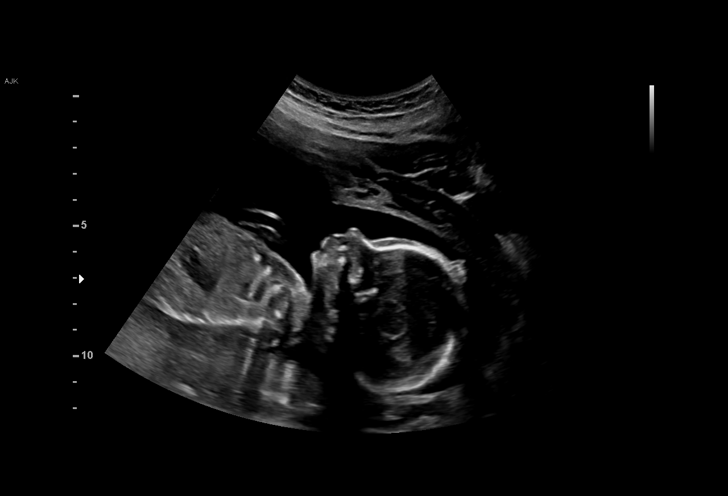
[im 3/33]
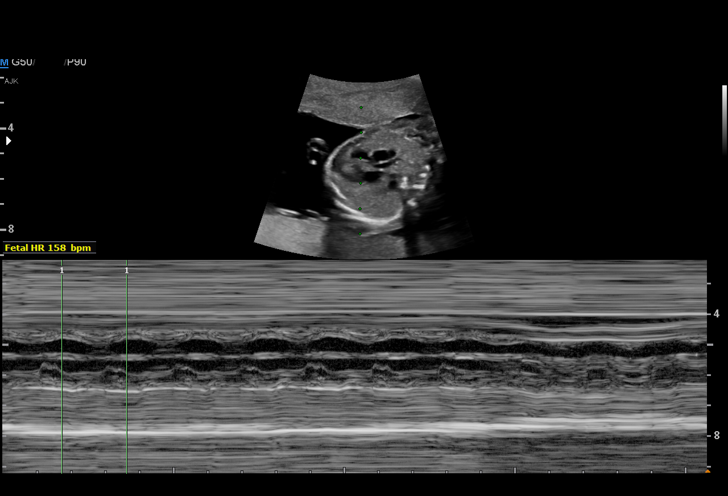
[im 5/33]
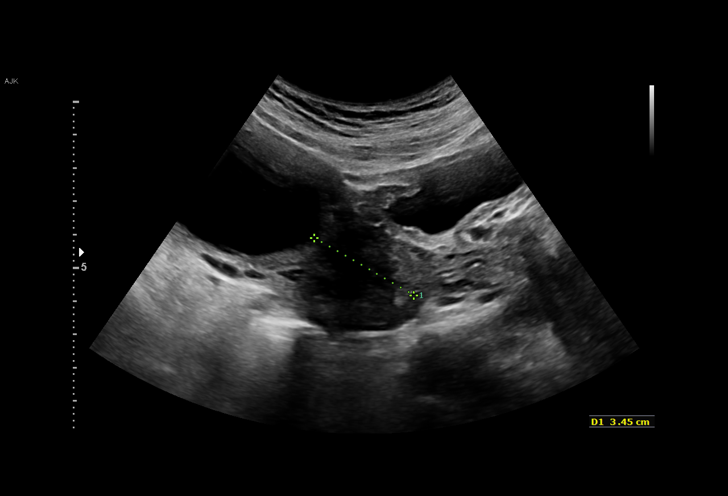
[im 8/33]
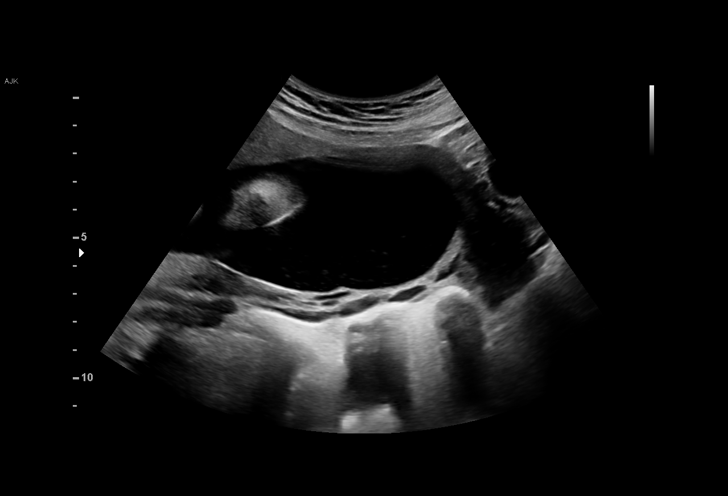
[im 10/33]
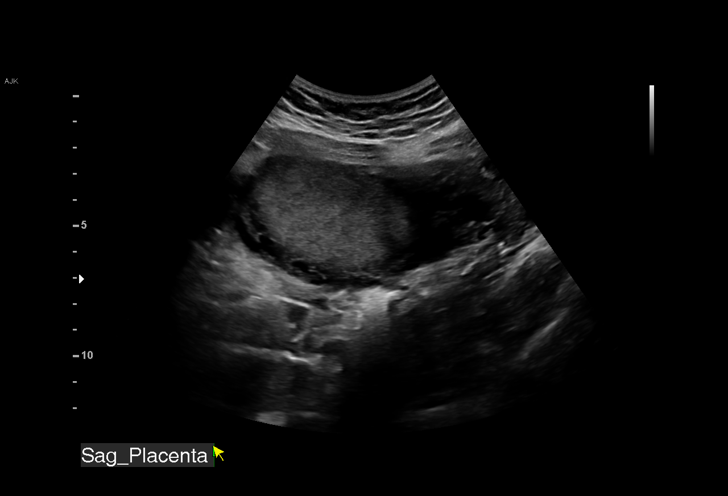
[im 12/33]
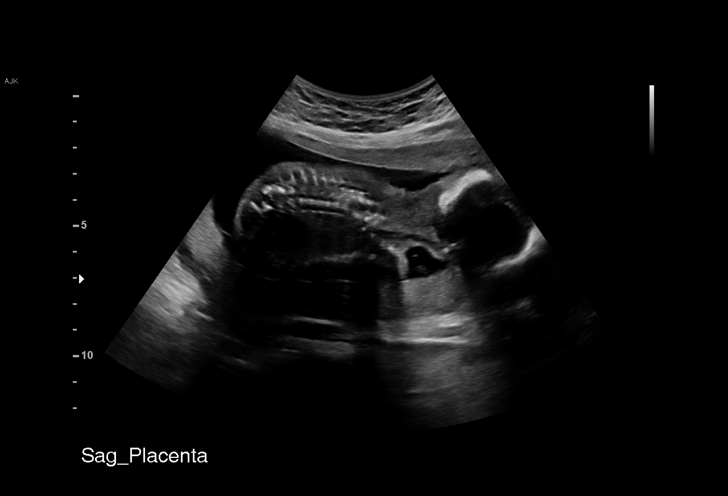
[im 15/33]
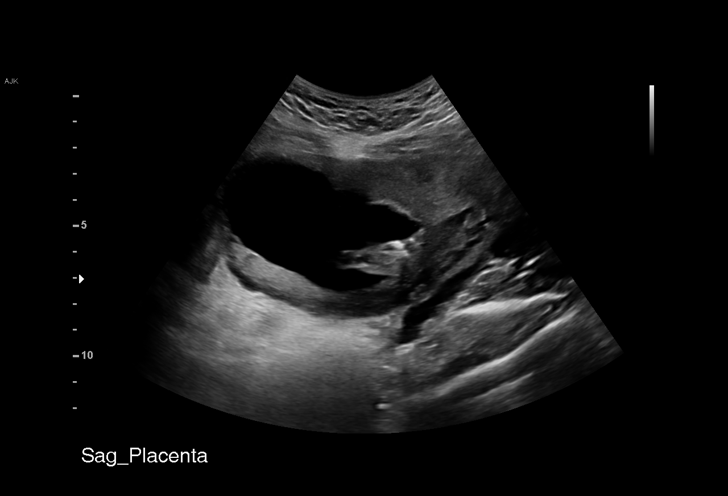
[im 17/33]
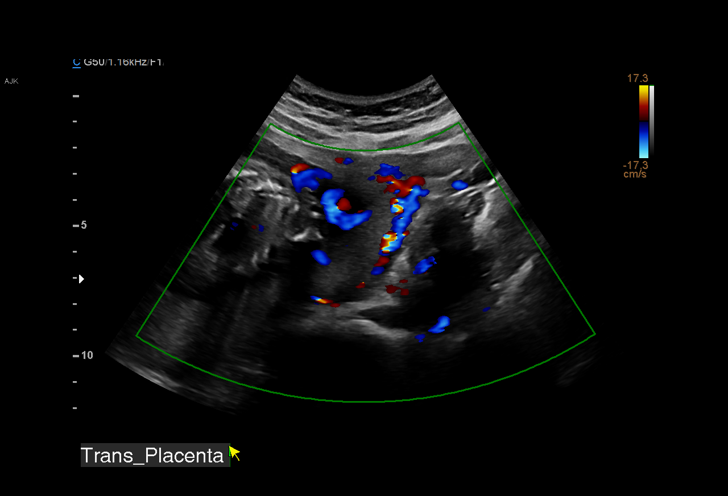
[im 18/33]
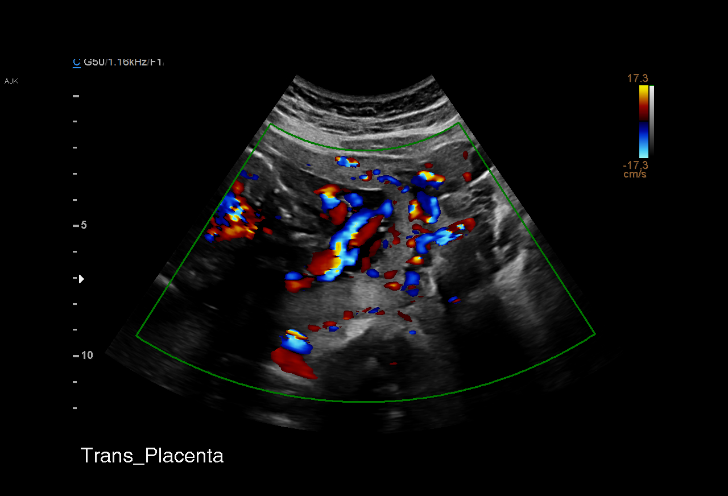
[im 21/33]
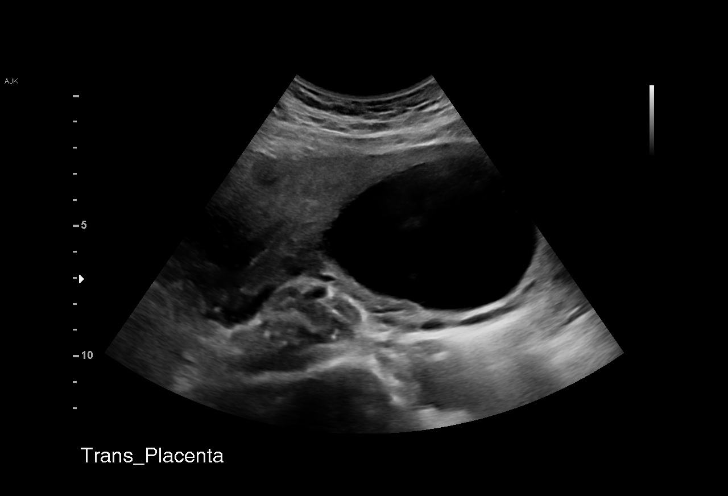
[im 23/33]
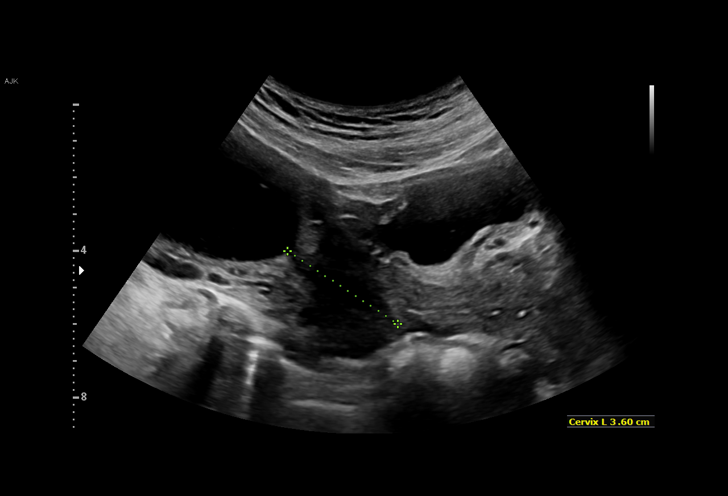
[im 25/33]
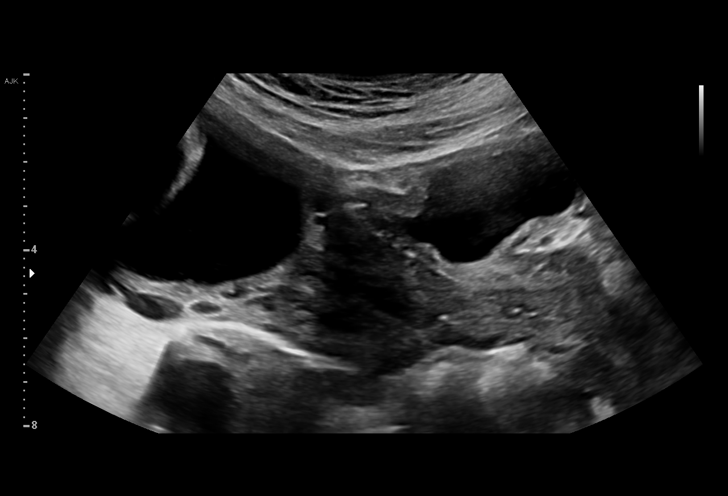
[im 28/33]
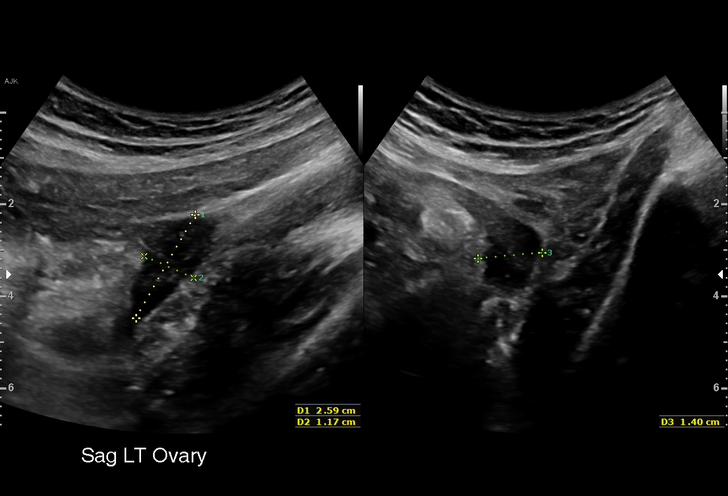
[im 30/33]
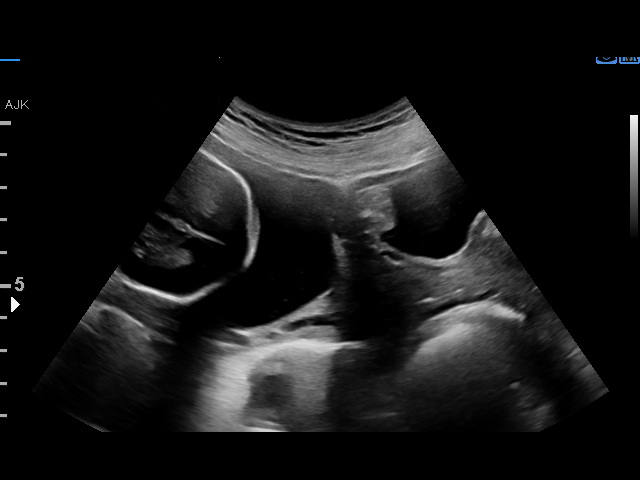
[im 33/33]
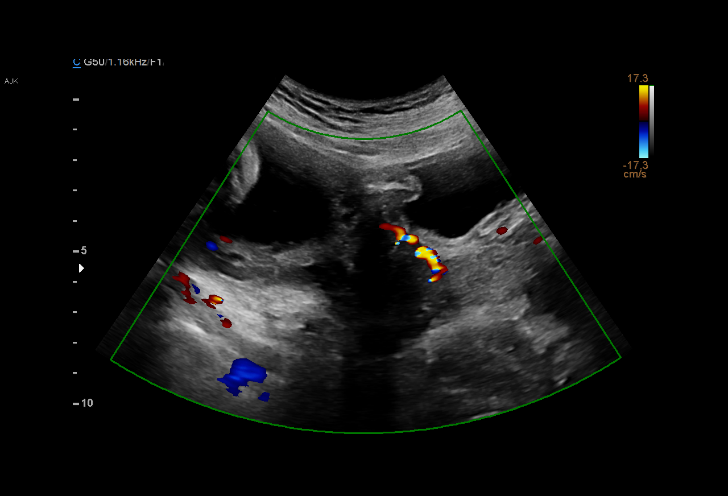

[15 of 28 positions shown; findings below may reference images not displayed]

Attending:        Mario Al Igrishta        Location:         [HOSPITAL]
[facility]

1  US MFM OB LIMITED                    76815.01     PERVANESAM RAKHAMIMOVA

Indications

Encounter for cervical length
Placenta previa specified as without
hemorrhage, second trimester (not found)
19 weeks gestation of pregnancy
Fetal Evaluation

Num Of Fetuses:         1
Fetal Heart Rate(bpm):  158
Cardiac Activity:       Observed
Presentation:           Cephalic
Placenta:               Posterior
P. Cord Insertion:      Visualized

Amniotic Fluid
AFI FV:      Within normal limits

Largest Pocket(cm)
4.83

Comment:    No placental abruption or previa identified on today's exam.
OB History

Gravidity:    1
Gestational Age

Clinical EDD:  19w 4d                                        EDD:   10/14/18
Best:          19w 4d     Det. By:  Clinical EDD             EDD:   10/14/18
Cervix Uterus Adnexa

Cervix
Length:           3.45  cm.
Normal appearance by transabdominal scan.

Uterus
No abnormality visualized.

Left Ovary
Within normal limits.

Right Ovary
Within normal limits.

Adnexa
No abnormality visualized.
Comments

U/S images reviewed. No evidence of placenta previa seen.
No fetal abnormalities seen.
Recommendations: 1) Follow-up as clinically indicated
Recommendations

Follow-up as clinically indicated

## 2019-12-30 ENCOUNTER — Encounter (HOSPITAL_COMMUNITY): Payer: Self-pay

## 2019-12-30 ENCOUNTER — Other Ambulatory Visit: Payer: Self-pay

## 2019-12-30 ENCOUNTER — Ambulatory Visit (HOSPITAL_COMMUNITY)
Admission: EM | Admit: 2019-12-30 | Discharge: 2019-12-30 | Disposition: A | Discharge status: Home / Self Care | Payer: Medicaid Other | Attending: Emergency Medicine | Admitting: Emergency Medicine

## 2019-12-30 ENCOUNTER — Telehealth (HOSPITAL_COMMUNITY): Payer: Self-pay

## 2019-12-30 DIAGNOSIS — N3000 Acute cystitis without hematuria: Secondary | ICD-10-CM | POA: Diagnosis present

## 2019-12-30 DIAGNOSIS — Z3202 Encounter for pregnancy test, result negative: Secondary | ICD-10-CM | POA: Diagnosis not present

## 2019-12-30 DIAGNOSIS — Z113 Encounter for screening for infections with a predominantly sexual mode of transmission: Secondary | ICD-10-CM | POA: Insufficient documentation

## 2019-12-30 DIAGNOSIS — N760 Acute vaginitis: Secondary | ICD-10-CM | POA: Diagnosis present

## 2019-12-30 LAB — POCT URINALYSIS DIP (DEVICE)
Bilirubin Urine: NEGATIVE
Glucose, UA: NEGATIVE mg/dL
Hgb urine dipstick: NEGATIVE
Ketones, ur: NEGATIVE mg/dL
Nitrite: NEGATIVE
Protein, ur: NEGATIVE mg/dL
Specific Gravity, Urine: 1.025 (ref 1.005–1.030)
Urobilinogen, UA: 0.2 mg/dL (ref 0.0–1.0)
pH: 7 (ref 5.0–8.0)

## 2019-12-30 LAB — HIV ANTIBODY (ROUTINE TESTING W REFLEX): HIV Screen 4th Generation wRfx: NONREACTIVE

## 2019-12-30 LAB — POC URINE PREG, ED: Preg Test, Ur: NEGATIVE

## 2019-12-30 MED ORDER — METRONIDAZOLE 0.75 % VA GEL: 1.0000 | Freq: Two times a day (BID) | VAGINAL | 0 refills | Status: DC

## 2019-12-30 MED ORDER — METRONIDAZOLE 500 MG PO TABS
500.0000 mg | ORAL_TABLET | Freq: Two times a day (BID) | ORAL | 0 refills | Status: DC
Start: 2019-12-30 — End: 2019-12-30

## 2019-12-30 MED ORDER — CEPHALEXIN 500 MG PO CAPS
500.0000 mg | ORAL_CAPSULE | Freq: Two times a day (BID) | ORAL | 0 refills | Status: DC
Start: 2019-12-30 — End: 2019-12-30

## 2019-12-30 MED ORDER — METRONIDAZOLE 500 MG PO TABS
500.0000 mg | ORAL_TABLET | Freq: Two times a day (BID) | ORAL | 0 refills | Status: DC
Start: 2019-12-30 — End: 2020-06-14

## 2019-12-30 MED ORDER — CEPHALEXIN 500 MG PO CAPS
500.0000 mg | ORAL_CAPSULE | Freq: Two times a day (BID) | ORAL | 0 refills | Status: AC
Start: 2019-12-30 — End: 2020-01-04

## 2019-12-30 NOTE — ED Provider Notes (Signed)
[facility]    CSN: [id number] Arrival date & time: 12/30/19  1234      History   Chief Complaint Chief Complaint  Patient presents with  . Vaginal Discharge  . Dysuria    HPI Jenny Chan is a 24 y.o. female.   HPI  Patient presents for routine STDs screening.  She endorses no known exposure however was concerned that she has had some thickening and discoloration of her vaginal discharge.  She also has concern for a UTI as she is experiencing some lower pelvic pressure and dysuria with urination. She denies CVA tenderness, chills, nausea, or vomiting. Afebrile on arrival today. Past Medical History:  Diagnosis Date  . Seizure-like activity [facility])     Patient Active Problem List   Diagnosis Date Noted  . SVD (spontaneous vaginal delivery) 10/18/2018  . Post term pregnancy at [redacted] weeks gestation 10/16/2018  . Observed seizure-like activity ([place]) 07/26/2018  . Severe episode of recurrent major depressive disorder, without psychotic features [facility])     Past Surgical History:  Procedure Laterality Date  . NO PAST SURGERIES      OB History    Gravida  1   Para  1   Term  1   Preterm      AB      Living  1     SAB      TAB      Ectopic      Multiple  0   Live Births  1            Home Medications    Prior to Admission medications   Medication Sig Start Date End Date Taking? Authorizing Provider  calcium carbonate (TUMS - DOSED IN MG ELEMENTAL CALCIUM) 500 MG chewable tablet Chew 2 tablets by mouth daily as needed for indigestion or heartburn.    [provider]  cephALEXin (KEFLEX) 500 MG capsule Take 1 capsule (500 mg total) by mouth 2 (two) times daily for 5 days. 12/30/19 01/04/20  Scot Jun, FNP  metroNIDAZOLE (FLAGYL) 500 MG tablet Take 1 tablet (500 mg total) by mouth 2 (two) times daily. 12/30/19   Scot Jun, FNP  Prenatal Vit-Fe Fumarate-FA (PRENATAL MULTIVITAMIN) TABS tablet Take 1 tablet by mouth daily  at 12 noon.    [provider]    Family History Family History  Problem Relation Age of Onset  . Healthy Mother   . Healthy Father     Social History Social History   Tobacco Use  . Smoking status: Never Smoker  . Smokeless tobacco: Never Used  Substance Use Topics  . Alcohol use: No  . Drug use: Never     Allergies   Patient has no known allergies.  Review of Systems Review of Systems Pertinent negatives listed in HPI  Physical Exam Triage Vital Signs ED Triage Vitals  Enc Vitals Group     BP 12/30/19 1259 103/71     Pulse Rate 12/30/19 1259 80     Resp 12/30/19 1259 16     Temp 12/30/19 1259 99.1 F (37.3 C)     Temp Source 12/30/19 1259 Oral     SpO2 12/30/19 1259 99 %     Weight --      Height --      Head Circumference --      Peak Flow --      Pain Score 12/30/19 1258 0     Pain Loc --  Pain Edu? --      Excl. in GC? --    No data found.  Updated Vital Signs BP 103/71 (BP Location: Right Arm)   Pulse 80   Temp 99.1 F (37.3 C) (Oral)   Resp 16   LMP 12/10/2019 (Exact Date)   SpO2 99%   Visual Acuity Right Eye Distance:   Left Eye Distance:   Bilateral Distance:    Right Eye Near:   Left Eye Near:    Bilateral Near:     Physical Exam   UC Treatments / Results  Labs (all labs ordered are listed, but only abnormal results are displayed) Labs Reviewed  POCT URINALYSIS DIP (DEVICE) - Abnormal; Notable for the following components:      Result Value   Leukocytes,Ua TRACE (*)    All other components within normal limits  URINE CULTURE  HIV ANTIBODY (ROUTINE TESTING W REFLEX)  RPR  POC URINE PREG, ED  CERVICOVAGINAL ANCILLARY ONLY    EKG   Radiology No results found.  Procedures Procedures (including critical care time)  Medications Ordered in UC Medications - No data to display  Initial Impression / Assessment and Plan / UC Course  I have reviewed the triage vital signs and the nursing notes.  Pertinent  labs & imaging results that were available during my care of the patient were reviewed by me and considered in my medical decision making (see chart for details).    1. Screen for STD (sexually transmitted disease) Urine pregnancy negative. STD panel pending.  Patient has had no known exposure therefore will not treat until cytology results.  Encourage barrier protection with this sexual intercourse.  2. Vaginitis and vulvovaginitis -Patient has history of BV given current symptoms will treat empirically with metronidazole 500 mg twice daily x7 days.  Please prescribe MetroGel this was discontinued and replaced with metronidazole oral tablets.  3. Acute cystitis without hematuria -UA significant for only trace of leukocytes however given patient's symptoms we will treat with Keflex 500 mg twice daily x5 days.  Urine culture is pending Final Clinical Impressions(s) / UC Diagnoses   Final diagnoses:  Screen for STD (sexually transmitted disease)  Vaginitis and vulvovaginitis  Acute cystitis without hematuria   Discharge Instructions   None    ED Prescriptions    Medication Sig Dispense Auth. Provider   cephALEXin (KEFLEX) 500 MG capsule Take 1 capsule (500 mg total) by mouth 2 (two) times daily for 5 days. 10 capsule Bing Neighbors, FNP   metroNIDAZOLE (METROGEL VAGINAL) 0.75 % vaginal gel Place 1 Applicatorful vaginally 2 (two) times daily. 70 g Bing Neighbors, FNP   metroNIDAZOLE (FLAGYL) 500 MG tablet Take 1 tablet (500 mg total) by mouth 2 (two) times daily. 14 tablet Bing Neighbors, FNP     PDMP not reviewed this encounter.   Bing Neighbors, FNP 12/31/19 1025

## 2019-12-30 NOTE — ED Triage Notes (Signed)
Pt reports having light yellow vaginal discharge, vaginal itchy and burning when urinating x 3 days.

## 2019-12-31 LAB — URINE CULTURE
Culture: 4000 — AB
Special Requests: NORMAL

## 2019-12-31 LAB — RPR: RPR Ser Ql: NONREACTIVE

## 2020-01-02 LAB — CERVICOVAGINAL ANCILLARY ONLY
Bacterial Vaginitis (gardnerella): POSITIVE — AB
Candida Glabrata: NEGATIVE
Candida Vaginitis: POSITIVE — AB
Chlamydia: NEGATIVE
Comment: NEGATIVE
Comment: NEGATIVE
Comment: NEGATIVE
Comment: NEGATIVE
Comment: NEGATIVE
Comment: NORMAL
Neisseria Gonorrhea: NEGATIVE
Trichomonas: NEGATIVE

## 2020-01-03 ENCOUNTER — Telehealth (HOSPITAL_COMMUNITY): Payer: Self-pay

## 2020-01-03 MED ORDER — FLUCONAZOLE 150 MG PO TABS
150.0000 mg | ORAL_TABLET | Freq: Every day | ORAL | 0 refills | Status: AC
Start: 2020-01-03 — End: 2020-01-05

## 2020-03-26 ENCOUNTER — Ambulatory Visit (HOSPITAL_COMMUNITY)
Admission: EM | Admit: 2020-03-26 | Discharge: 2020-03-26 | Disposition: A | Discharge status: Home / Self Care | Payer: Medicaid Other | Attending: Internal Medicine | Admitting: Internal Medicine

## 2020-03-26 ENCOUNTER — Encounter (HOSPITAL_COMMUNITY): Payer: Self-pay | Admitting: Emergency Medicine

## 2020-03-26 ENCOUNTER — Other Ambulatory Visit: Payer: Self-pay

## 2020-03-26 DIAGNOSIS — Z20822 Contact with and (suspected) exposure to covid-19: Secondary | ICD-10-CM | POA: Insufficient documentation

## 2020-03-26 DIAGNOSIS — J988 Other specified respiratory disorders: Secondary | ICD-10-CM | POA: Insufficient documentation

## 2020-03-26 DIAGNOSIS — B9789 Other viral agents as the cause of diseases classified elsewhere: Secondary | ICD-10-CM | POA: Insufficient documentation

## 2020-03-26 DIAGNOSIS — H1033 Unspecified acute conjunctivitis, bilateral: Secondary | ICD-10-CM | POA: Insufficient documentation

## 2020-03-26 LAB — SARS CORONAVIRUS 2 (TAT 6-24 HRS): SARS Coronavirus 2: NEGATIVE

## 2020-03-26 MED ORDER — NEOMYCIN-POLYMYXIN-HC 3.5-10000-1 OP SUSP
3.0000 [drp] | Freq: Three times a day (TID) | OPHTHALMIC | 0 refills | Status: DC
Start: 2020-03-26 — End: 2022-05-08

## 2020-03-26 NOTE — ED Provider Notes (Addendum)
[facility]    CSN: [id number] Arrival date & time: 03/26/20  0175      History   Chief Complaint Chief Complaint  Patient presents with  . Cough    HPI Jenny Chan is a 24 y.o. female.   HPI Patient presents with symptoms suspicious for Covid.  Patient complains of 3 to 4 days of sore throat, mild cough, in right red eye. She has a low-grade fever of 99.3.No known exposure to anyone that has tested positive for COVID-19.  She is unvaccinated. Past Medical History:  Diagnosis Date  . Seizure-like activity [facility])     Patient Active Problem List   Diagnosis Date Noted  . SVD (spontaneous vaginal delivery) 10/18/2018  . Post term pregnancy at [redacted] weeks gestation 10/16/2018  . Observed seizure-like activity (HCC) 07/26/2018  . Severe episode of recurrent major depressive disorder, without psychotic features [facility])     Past Surgical History:  Procedure Laterality Date  . NO PAST SURGERIES      OB History    Gravida  1   Para  1   Term  1   Preterm      AB      Living  1     SAB      TAB      Ectopic      Multiple  0   Live Births  1            Home Medications    Prior to Admission medications   Medication Sig Start Date End Date Taking? Authorizing Provider  calcium carbonate (TUMS - DOSED IN MG ELEMENTAL CALCIUM) 500 MG chewable tablet Chew 2 tablets by mouth daily as needed for indigestion or heartburn.    [provider]  metroNIDAZOLE (FLAGYL) 500 MG tablet Take 1 tablet (500 mg total) by mouth 2 (two) times daily. 12/30/19   Bing Neighbors, FNP  Prenatal Vit-Fe Fumarate-FA (PRENATAL MULTIVITAMIN) TABS tablet Take 1 tablet by mouth daily at 12 noon.    [provider]    Family History Family History  Problem Relation Age of Onset  . Healthy Mother   . Healthy Father     Social History Social History   Tobacco Use  . Smoking status: Never Smoker  . Smokeless tobacco: Never Used  Vaping Use  .  Vaping Use: Never used  Substance Use Topics  . Alcohol use: No  . Drug use: Never     Allergies   Patient has no known allergies.   Review of Systems Review of Systems Pertinent negatives listed in HPI Physical Exam Triage Vital Signs ED Triage Vitals  Enc Vitals Group     BP 03/26/20 0917 101/70     Pulse Rate 03/26/20 0917 97     Resp 03/26/20 0917 18     Temp 03/26/20 0917 99.3 F (37.4 C)     Temp Source 03/26/20 0917 Oral     SpO2 03/26/20 0917 99 %     Weight --      Height --      Head Circumference --      Peak Flow --      Pain Score 03/26/20 0914 5     Pain Loc --      Pain Edu? --      Excl. in GC? --    No data found.  Updated Vital Signs BP 101/70 (BP Location: Right Arm)   Pulse 97   Temp 99.3  F (37.4 C) (Oral)   Resp 18   LMP 03/26/2020   SpO2 99%   Visual Acuity Right Eye Distance:   Left Eye Distance:   Bilateral Distance:    Right Eye Near:   Left Eye Near:    Bilateral Near:     Physical Exam Constitutional:      Appearance: She is ill-appearing.  HENT:     Head: Normocephalic and atraumatic.     Nose: Nose normal.     Mouth/Throat:     Mouth: Mucous membranes are dry.  Eyes:     General:        Right eye: Discharge present.        Left eye: Discharge present. Cardiovascular:     Rate and Rhythm: Normal rate and regular rhythm.  Pulmonary:     Effort: Pulmonary effort is normal.     Breath sounds: Normal breath sounds.  Neurological:     General: No focal deficit present.     Mental Status: She is oriented to person, place, and time.  Psychiatric:        Mood and Affect: Mood normal.     UC Treatments / Results  Labs (all labs ordered are listed, but only abnormal results are displayed) Labs Reviewed  SARS CORONAVIRUS 2 (TAT 6-24 HRS)    EKG   Radiology No results found.  Procedures Procedures (including critical care time)  Medications Ordered in UC Medications - No data to display  Initial  Impression / Assessment and Plan / UC Course  I have reviewed the triage vital signs and the nursing notes.  Pertinent labs & imaging results that were available during my care of the patient were reviewed by me and considered in my medical decision making (see chart for details).     COVID-19 test pending.  Treating for acute bacterial conjunctivitis involving both eyes.  Cortisporin eyedrops 3 drops 3 times daily x7 days.  Like discussed. An After Visit Summary was printed and given to the patient/family. Precautions discussed. Red flags discussed. Questions invited and answered. They voiced understanding and agreement.  Final Clinical Impressions(s) / UC Diagnoses   Final diagnoses:  Suspected COVID-19 virus infection  Viral respiratory illness  Acute bacterial conjunctivitis of both eyes     Discharge Instructions     COVID-19 test pending. Self isolate while test is pending.  You have been prescribed eyedrops 3 drops to each eye 3 times daily for total 7 days.  If your eye infection worsens return for evaluation.    ED Prescriptions    Medication Sig Dispense Auth. Provider   neomycin-polymyxin-hydrocortisone (CORTISPORIN) 3.5-10000-1 ophthalmic suspension Place 3 drops into both eyes 3 (three) times daily. 7.5 mL Bing Neighbors, FNP     PDMP not reviewed this encounter.   Bing Neighbors, FNP 03/26/20 0944    Bing Neighbors, FNP 03/26/20 [phone]

## 2020-03-26 NOTE — Discharge Instructions (Signed)
COVID-19 test pending. Self isolate while test is pending.  You have been prescribed eyedrops 3 drops to each eye 3 times daily for total 7 days.  If your eye infection worsens return for evaluation.

## 2020-03-26 NOTE — ED Notes (Signed)
covid swab obtained, lid secure, liquid in vial, placed in lab

## 2020-03-26 NOTE — ED Triage Notes (Signed)
Onset of cough and sore throat 3-4 days ago.  Right eye is red, no change in vision.  Denies fever, cough is non-productive

## 2020-06-13 ENCOUNTER — Encounter (HOSPITAL_COMMUNITY): Payer: Self-pay

## 2020-06-13 ENCOUNTER — Other Ambulatory Visit: Payer: Self-pay

## 2020-06-13 ENCOUNTER — Ambulatory Visit (HOSPITAL_COMMUNITY)
Admission: EM | Admit: 2020-06-13 | Discharge: 2020-06-13 | Disposition: A | Discharge status: Home / Self Care | Payer: Medicaid Other | Attending: Family Medicine | Admitting: Family Medicine

## 2020-06-13 DIAGNOSIS — Z113 Encounter for screening for infections with a predominantly sexual mode of transmission: Secondary | ICD-10-CM | POA: Diagnosis present

## 2020-06-13 NOTE — Discharge Instructions (Addendum)
We have sent testing for sexually transmitted infections. We will notify you of any positive results once they are received. If required, we will prescribe any medications you might need.  Please refrain from all sexual activity for at least the next seven days.  

## 2020-06-13 NOTE — ED Provider Notes (Signed)
[facility]   [id number] 06/13/20 Arrival Time: 0904  ASSESSMENT & PLAN:  1. Screening for STDs (sexually transmitted diseases)       Discharge Instructions     We have sent testing for sexually transmitted infections. We will notify you of any positive results once they are received. If required, we will prescribe any medications you might need.  Please refrain from all sexual activity for at least the next seven days.     Without s/s of PID. No empiric treatment desired. Declines HIV/RPR.  Labs Reviewed  CERVICOVAGINAL ANCILLARY ONLY      Will notify of any positive results. Instructed to refrain from sexual activity for at least seven days.  Reviewed expectations re: course of current medical issues. Questions answered. Outlined signs and symptoms indicating need for more acute intervention. Patient verbalized understanding. After Visit Summary given.   SUBJECTIVE:  Jenny Chan is a 24 y.o. female who requests STD screening. No symptoms including vaginal discharge. Afebrile. No abdominal or pelvic pain. Normal PO intake wihout n/v. No genital rashes or lesions.   OBJECTIVE:  Vitals:   06/13/20 0953  BP: 105/66  Pulse: 95  Resp: 18  Temp: 99.2 F (37.3 C)  TempSrc: Oral  SpO2: 100%     General appearance: alert, cooperative, appears stated age and no distress Lungs: unlabored respirations; speaks full sentences without difficulty Back: no CVA tenderness; FROM at waist Abdomen: soft, non-tender GU: deferred Skin: warm and dry Psychological: alert and cooperative; normal mood and affect.  Results for orders placed or performed during the hospital encounter of 03/26/20  SARS CORONAVIRUS 2 (TAT 6-24 HRS) Nasopharyngeal Nasopharyngeal Swab   Specimen: Nasopharyngeal Swab  Result Value Ref Range   SARS Coronavirus 2 NEGATIVE NEGATIVE    Labs Reviewed  CERVICOVAGINAL ANCILLARY ONLY    No Known Allergies  Past Medical History:   Diagnosis Date  . Seizure-like activity (HCC)    Family History  Problem Relation Age of Onset  . Healthy Mother   . Healthy Father    Social History   Socioeconomic History  . Marital status: Single    Spouse name: Not on file  . Number of children: 0  . Years of education: 23  . Highest education level: High school graduate  Occupational History  . Occupation: unemployed  Tobacco Use  . Smoking status: Never Smoker  . Smokeless tobacco: Never Used  Vaping Use  . Vaping Use: Never used  Substance and Sexual Activity  . Alcohol use: No  . Drug use: Never  . Sexual activity: Yes    Birth control/protection: None  Other Topics Concern  . Not on file  Social History Narrative   Right-handed.   Occasional caffeine use.   Lives at home with her mother.   Social Determinants of Health   Financial Resource Strain:   . Difficulty of Paying Living Expenses: Not on file  Food Insecurity:   . Worried About Programme researcher, broadcasting/film/video in the Last Year: Not on file  . Ran Out of Food in the Last Year: Not on file  Transportation Needs:   . Lack of Transportation (Medical): Not on file  . Lack of Transportation (Non-Medical): Not on file  Physical Activity:   . Days of Exercise per Week: Not on file  . Minutes of Exercise per Session: Not on file  Stress:   . Feeling of Stress : Not on file  Social Connections:   . Frequency of Communication with Friends  and Family: Not on file  . Frequency of Social Gatherings with Friends and Family: Not on file  . Attends Religious Services: Not on file  . Active Member of Clubs or Organizations: Not on file  . Attends Banker Meetings: Not on file  . Marital Status: Not on file  Intimate Partner Violence:   . Fear of Current or Ex-Partner: Not on file  . Emotionally Abused: Not on file  . Physically Abused: Not on file  . Sexually Abused: Not on file          Mardella Layman, MD 06/13/20 1209

## 2020-06-13 NOTE — ED Triage Notes (Signed)
Pt presents for STD testing with no complaints of any symptoms.  

## 2020-06-14 ENCOUNTER — Telehealth (HOSPITAL_COMMUNITY): Payer: Self-pay | Admitting: Emergency Medicine

## 2020-06-14 LAB — CERVICOVAGINAL ANCILLARY ONLY
Bacterial Vaginitis (gardnerella): POSITIVE — AB
Candida Glabrata: NEGATIVE
Candida Vaginitis: NEGATIVE
Chlamydia: NEGATIVE
Comment: NEGATIVE
Comment: NEGATIVE
Comment: NEGATIVE
Comment: NEGATIVE
Comment: NEGATIVE
Comment: NORMAL
Neisseria Gonorrhea: NEGATIVE
Trichomonas: NEGATIVE

## 2020-06-14 MED ORDER — METRONIDAZOLE 500 MG PO TABS: 500.0000 mg | ORAL_TABLET | Freq: Two times a day (BID) | ORAL | 0 refills | Status: DC

## 2021-02-25 ENCOUNTER — Encounter (HOSPITAL_COMMUNITY): Payer: Self-pay

## 2021-02-25 ENCOUNTER — Ambulatory Visit (HOSPITAL_COMMUNITY)
Admission: EM | Admit: 2021-02-25 | Discharge: 2021-02-25 | Disposition: A | Discharge status: Home / Self Care | Payer: Medicaid Other | Attending: Physician Assistant | Admitting: Physician Assistant

## 2021-02-25 DIAGNOSIS — Z113 Encounter for screening for infections with a predominantly sexual mode of transmission: Secondary | ICD-10-CM | POA: Insufficient documentation

## 2021-02-25 DIAGNOSIS — R300 Dysuria: Secondary | ICD-10-CM | POA: Insufficient documentation

## 2021-02-25 DIAGNOSIS — N898 Other specified noninflammatory disorders of vagina: Secondary | ICD-10-CM | POA: Insufficient documentation

## 2021-02-25 DIAGNOSIS — N3001 Acute cystitis with hematuria: Secondary | ICD-10-CM | POA: Insufficient documentation

## 2021-02-25 LAB — POCT URINALYSIS DIPSTICK, ED / UC
Bilirubin Urine: NEGATIVE
Glucose, UA: NEGATIVE mg/dL
Ketones, ur: NEGATIVE mg/dL
Nitrite: NEGATIVE
Protein, ur: NEGATIVE mg/dL
Specific Gravity, Urine: 1.025 (ref 1.005–1.030)
Urobilinogen, UA: 1 mg/dL (ref 0.0–1.0)
pH: 7 (ref 5.0–8.0)

## 2021-02-25 LAB — POC URINE PREG, ED
Preg Test, Ur: NEGATIVE
Preg Test, Ur: NEGATIVE

## 2021-02-25 MED ORDER — NITROFURANTOIN MONOHYD MACRO 100 MG PO CAPS
100.0000 mg | ORAL_CAPSULE | Freq: Two times a day (BID) | ORAL | 0 refills | Status: DC
Start: 2021-02-25 — End: 2022-05-08

## 2021-02-25 NOTE — ED Triage Notes (Signed)
Pt c/o flank pain, pain on urination, itching, and discharge. No changes in urination pattern.   Started last week.   No interventions taken.

## 2021-02-25 NOTE — Discharge Instructions (Signed)
Your urine had some evidence of infection so we are starting an antibiotic known as Macrobid.  If we need to adjust your antibiotic based on your culture we will contact you.  If we need to treat you for anything else based on your swab we will call you.  Please make sure you are drinking plenty of fluid.  If you have any worsening symptoms including persistent urinary tract infection symptoms, vaginal discharge, abdominal pain, nausea, vomiting, fever you need to be reevaluated.  Please follow-up with either our clinic or your primary care provider in a few weeks to ensure blood noted in your urine today has resolved.

## 2021-02-25 NOTE — ED Provider Notes (Signed)
[facility]    CSN: [id number] Arrival date & time: 02/25/21  1358      History   Chief Complaint Chief Complaint  Patient presents with   Flank Pain   Vaginal Discharge   STD testing     HPI Jenny Chan is a 25 y.o. female.   Patient presents today with a several day history of vaginal irritation.  She reports copious thin malodorous discharge.  She does have a history of STI but states completing treatment and having negative test of cure.  She is sexually active but has been consistently using condoms.  She reports associated dysuria, urinary urgency.  She has not tried any over-the-counter medications for symptom management.  She does have a history of UTI but states current symptoms are more similar to previous episodes of bacterial vaginosis.  She denies any recent antibiotic use.  She denies any pelvic pain, fever, nausea, vomiting, dizziness, syncope.  She denies history of nephrolithiasis or seeing urologist in the past.   Past Medical History:  Diagnosis Date   Seizure-like activity [facility])     Patient Active Problem List   Diagnosis Date Noted   SVD (spontaneous vaginal delivery) 10/18/2018   Post term pregnancy at [redacted] weeks gestation 10/16/2018   Observed seizure-like activity (HCC) 07/26/2018   Severe episode of recurrent major depressive disorder, without psychotic features (HCC)     Past Surgical History:  Procedure Laterality Date   NO PAST SURGERIES      OB History     Gravida  1   Para  1   Term  1   Preterm      AB      Living  1      SAB      IAB      Ectopic      Multiple  0   Live Births  1            Home Medications    Prior to Admission medications   Medication Sig Start Date End Date Taking? Authorizing Provider  nitrofurantoin, macrocrystal-monohydrate, (MACROBID) 100 MG capsule Take 1 capsule (100 mg total) by mouth 2 (two) times daily. 02/25/21  Yes Eyla Tallon K, PA-C  calcium carbonate (TUMS -  DOSED IN MG ELEMENTAL CALCIUM) 500 MG chewable tablet Chew 2 tablets by mouth daily as needed for indigestion or heartburn.    [provider]  neomycin-polymyxin-hydrocortisone (CORTISPORIN) 3.5-10000-1 ophthalmic suspension Place 3 drops into both eyes 3 (three) times daily. 03/26/20   Bing Neighbors, FNP  Prenatal Vit-Fe Fumarate-FA (PRENATAL MULTIVITAMIN) TABS tablet Take 1 tablet by mouth daily at 12 noon.    [provider]    Family History Family History  Problem Relation Age of Onset   Healthy Mother    Healthy Father     Social History Social History   Tobacco Use   Smoking status: Never   Smokeless tobacco: Never  Vaping Use   Vaping Use: Never used  Substance Use Topics   Alcohol use: Yes   Drug use: Never     Allergies   Patient has no known allergies.   Review of Systems Review of Systems  Constitutional:  Negative for activity change, appetite change, fatigue and fever.  Respiratory:  Negative for cough and shortness of breath.   Cardiovascular:  Negative for chest pain.  Gastrointestinal:  Negative for abdominal pain, diarrhea, nausea and vomiting.  Genitourinary:  Positive for dysuria, frequency and vaginal discharge. Negative  for urgency, vaginal bleeding and vaginal pain.  Musculoskeletal:  Negative for arthralgias and myalgias.  Neurological:  Negative for dizziness, light-headedness and headaches.    Physical Exam Triage Vital Signs ED Triage Vitals [02/25/21 1531]  Enc Vitals Group     BP 104/66     Pulse Rate 89     Resp 20     Temp 99.2 F (37.3 C)     Temp Source Oral     SpO2 98 %     Weight      Height      Head Circumference      Peak Flow      Pain Score 3     Pain Loc      Pain Edu?      Excl. in GC?    No data found.  Updated Vital Signs BP 104/66 (BP Location: Left Arm)   Pulse 89   Temp 99.2 F (37.3 C) (Oral)   Resp 20   LMP 01/04/2021   SpO2 98%   Visual Acuity Right Eye Distance:   Left  Eye Distance:   Bilateral Distance:    Right Eye Near:   Left Eye Near:    Bilateral Near:     Physical Exam Vitals reviewed.  Constitutional:      General: She is awake. She is not in acute distress.    Appearance: Normal appearance. She is normal weight. She is not ill-appearing.     Comments: Very pleasant female appears stated age in no acute distress  HENT:     Head: Normocephalic and atraumatic.  Cardiovascular:     Rate and Rhythm: Normal rate and regular rhythm.     Heart sounds: Normal heart sounds, S1 normal and S2 normal. No murmur heard. Pulmonary:     Effort: Pulmonary effort is normal.     Breath sounds: Normal breath sounds. No wheezing, rhonchi or rales.     Comments: Clear to auscultation bilaterally Abdominal:     General: Bowel sounds are normal.     Palpations: Abdomen is soft.     Tenderness: There is no abdominal tenderness. There is no right CVA tenderness, left CVA tenderness, guarding or rebound.     Comments: Benign abdominal exam.  Genitourinary:    Comments: Exam deferred Psychiatric:        Behavior: Behavior is cooperative.     UC Treatments / Results  Labs (all labs ordered are listed, but only abnormal results are displayed) Labs Reviewed  POCT URINALYSIS DIPSTICK, ED / UC - Abnormal; Notable for the following components:      Result Value   Hgb urine dipstick TRACE (*)    Leukocytes,Ua TRACE (*)    All other components within normal limits  URINE CULTURE  POC URINE PREG, ED  POC URINE PREG, ED  CERVICOVAGINAL ANCILLARY ONLY    EKG   Radiology No results found.  Procedures Procedures (including critical care time)  Medications Ordered in UC Medications - No data to display  Initial Impression / Assessment and Plan / UC Course  I have reviewed the triage vital signs and the nursing notes.  Pertinent labs & imaging results that were available during my care of the patient were reviewed by me and considered in my medical  decision making (see chart for details).      Vital signs and physical exam reassuring today; no indication for emergent evaluation or imaging.  UA showed trace hemoglobin and leukocyte Estrace.  Given abnormal  urinalysis and UTI symptoms will treat with Macrobid.  Urine culture obtained-results pending.  Discussed potential need to change antibiotics based on susceptibilities identified on culture.  STI swab collected today-results pending.  We will contact patient if additional treatment is required.  Offered full STI panel including HIV, hepatitis, RPR the patient inclined this today prefers swab only.  She was encouraged to drink plenty of fluid.  Discussed alarm symptoms that warrant emergent evaluation at length to which patient expressed understanding.  Strict return precautions given to which patient expressed understanding.  Final Clinical Impressions(s) / UC Diagnoses   Final diagnoses:  Dysuria  Acute cystitis with hematuria  Vaginal discharge  Routine screening for STI (sexually transmitted infection)     Discharge Instructions      Your urine had some evidence of infection so we are starting an antibiotic known as Macrobid.  If we need to adjust your antibiotic based on your culture we will contact you.  If we need to treat you for anything else based on your swab we will call you.  Please make sure you are drinking plenty of fluid.  If you have any worsening symptoms including persistent urinary tract infection symptoms, vaginal discharge, abdominal pain, nausea, vomiting, fever you need to be reevaluated.  Please follow-up with either our clinic or your primary care provider in a few weeks to ensure blood noted in your urine today has resolved.     ED Prescriptions     Medication Sig Dispense Auth. Provider   nitrofurantoin, macrocrystal-monohydrate, (MACROBID) 100 MG capsule Take 1 capsule (100 mg total) by mouth 2 (two) times daily. 10 capsule Shannie Kontos, Noberto Retort, PA-C       PDMP not reviewed this encounter.   Jeani Hawking, PA-C 02/25/21 1615

## 2021-02-26 ENCOUNTER — Telehealth (HOSPITAL_COMMUNITY): Payer: Self-pay | Admitting: Emergency Medicine

## 2021-02-26 LAB — CERVICOVAGINAL ANCILLARY ONLY
Bacterial Vaginitis (gardnerella): POSITIVE — AB
Candida Glabrata: NEGATIVE
Candida Vaginitis: NEGATIVE
Chlamydia: NEGATIVE
Comment: NEGATIVE
Comment: NEGATIVE
Comment: NEGATIVE
Comment: NEGATIVE
Comment: NEGATIVE
Comment: NORMAL
Neisseria Gonorrhea: NEGATIVE
Trichomonas: NEGATIVE

## 2021-02-26 LAB — POC URINE PREG, ED: Preg Test, Ur: NEGATIVE

## 2021-02-26 MED ORDER — METRONIDAZOLE 500 MG PO TABS: 500.0000 mg | ORAL_TABLET | Freq: Two times a day (BID) | ORAL | 0 refills | Status: DC

## 2021-02-27 LAB — URINE CULTURE

## 2021-03-05 ENCOUNTER — Telehealth (HOSPITAL_COMMUNITY): Payer: Self-pay | Admitting: Emergency Medicine

## 2021-03-05 MED ORDER — CLINDAMYCIN PHOSPHATE 2 % VA CREA: 1.0000 | TOPICAL_CREAM | Freq: Every day | VAGINAL | 0 refills | Status: DC

## 2021-03-05 NOTE — Telephone Encounter (Signed)
Patient states she develops a rash with Metronidazole and will need a prescription change for recent positive BV.  Added to allergies and clindamycin cream sent, per protocol

## 2022-01-06 ENCOUNTER — Telehealth: Payer: Medicaid Other | Admitting: Emergency Medicine

## 2022-01-06 DIAGNOSIS — R599 Enlarged lymph nodes, unspecified: Secondary | ICD-10-CM | POA: Diagnosis not present

## 2022-01-06 NOTE — Progress Notes (Signed)
?Virtual Visit Consent  ? ?Jenny Chan, you are scheduled for a virtual visit with a [facility] provider today. Just as with appointments in the office, your consent must be obtained to participate. Your consent will be active for this visit and any virtual visit you may have with one of our providers in the next 365 days. If you have a MyChart account, a copy of this consent can be sent to you electronically. ? ?As this is a virtual visit, video technology does not allow for your provider to perform a traditional examination. This may limit your provider's ability to fully assess your condition. If your provider identifies any concerns that need to be evaluated in person or the need to arrange testing (such as labs, EKG, etc.), we will make arrangements to do so. Although advances in technology are sophisticated, we cannot ensure that it will always work on either your end or our end. If the connection with a video visit is poor, the visit may have to be switched to a telephone visit. With either a video or telephone visit, we are not always able to ensure that we have a secure connection. ? ?By engaging in this virtual visit, you consent to the provision of healthcare and authorize for your insurance to be billed (if applicable) for the services provided during this visit. Depending on your insurance coverage, you may receive a charge related to this service. ? ?I need to obtain your verbal consent now. Are you willing to proceed with your visit today? Jenny Chan has provided verbal consent on 01/06/2022 for a virtual visit (video or telephone). Cathlyn Parsons, NP ? ?Date: 01/06/2022 4:15 PM ? ?Virtual Visit via Video Note  ? ?Jenny Chan, connected with  Jenny Chan  ([id number], [birth year]) on 01/06/22 at  3:30 PM EDT by a video-enabled telemedicine application and verified that I am speaking with the correct person using two identifiers. ? ?Location: ?Patient: Virtual Visit Location Patient:  Home ?Provider: Virtual Visit Location Provider: Home Office ?  ?I discussed the limitations of evaluation and management by telemedicine and the availability of in person appointments. The patient expressed understanding and agreed to proceed.   ? ?History of Present Illness: ?Jenny Chan is a 26 y.o. who identifies as a female who was assigned female at birth, and is being seen today for a lump on the back of her neck.  She discovered the lump a couple of days ago.  It is at the base of her skull right at her hairline on the left side.  It is not painful unless she manipulates it a lot.  She has never noticed it before.  She does frequently get her hair done and complicated styles that sometimes causes pain in her scalp.  She last had her hair done 2 weeks ago and took it out yesterday.  She denies any cuts or infection in her scalp.  Denies fever or chills.  Denies weight loss. ? ?HPI: HPI  ?Problems:  ?Patient Active Problem List  ? Diagnosis Date Noted  ? SVD (spontaneous vaginal delivery) 10/18/2018  ? Post term pregnancy at [redacted] weeks gestation 10/16/2018  ? Observed seizure-like activity (HCC) 07/26/2018  ? Severe episode of recurrent major depressive disorder, without psychotic features (HCC)   ?  ?Allergies:  ?Allergies  ?Allergen Reactions  ? Metronidazole Rash  ? ?Medications:  ?Current Outpatient Medications:  ?  calcium carbonate (TUMS - DOSED IN MG ELEMENTAL CALCIUM) 500 MG chewable tablet, Chew 2  tablets by mouth daily as needed for indigestion or heartburn., Disp: , Rfl:  ?  clindamycin (CLEOCIN) 2 % vaginal cream, Place 1 Applicatorful vaginally at bedtime. for 7 days, Disp: 40 g, Rfl: 0 ?  metroNIDAZOLE (FLAGYL) 500 MG tablet, Take 1 tablet (500 mg total) by mouth 2 (two) times daily., Disp: 14 tablet, Rfl: 0 ?  neomycin-polymyxin-hydrocortisone (CORTISPORIN) 3.5-10000-1 ophthalmic suspension, Place 3 drops into both eyes 3 (three) times daily., Disp: 7.5 mL, Rfl: 0 ?  nitrofurantoin,  macrocrystal-monohydrate, (MACROBID) 100 MG capsule, Take 1 capsule (100 mg total) by mouth 2 (two) times daily., Disp: 10 capsule, Rfl: 0 ?  Prenatal Vit-Fe Fumarate-FA (PRENATAL MULTIVITAMIN) TABS tablet, Take 1 tablet by mouth daily at 12 noon., Disp: , Rfl:  ? ?Observations/Objective: ?Patient is well-developed, well-nourished in no acute distress.  ?Resting comfortably  at home.  ?Head is normocephalic, atraumatic.  ?No labored breathing.  ?Speech is clear and coherent with logical content.  ?Patient is alert and oriented at baseline.  ? ? ?Assessment and Plan: ?1. Palpable lymph node ? ?This may be related to getting her hair done and complicated styles and having her hair and scalp manipulated.  Likely this is a benign lymph node.  Advised patient not to manipulate it but to keep an eye on it and if it does not resolve or if she develops concerning symptoms of illness, she can have it rechecked. ? ?Follow Up Instructions: ?I discussed the assessment and treatment plan with the patient. The patient was provided an opportunity to ask questions and all were answered. The patient agreed with the plan and demonstrated an understanding of the instructions.  A copy of instructions were sent to the patient via MyChart unless otherwise noted below.  ? ?The patient was advised to call back or seek an in-person evaluation if the symptoms worsen or if the condition fails to improve as anticipated. ? ?Time:  ?I spent 10 minutes with the patient via telehealth technology discussing the above problems/concerns.   ? ?Cathlyn Parsons, NP ?

## 2022-01-06 NOTE — Patient Instructions (Signed)
?  Shawnie Pons, thank you for joining Carvel Getting, NP for today's virtual visit.  While this provider is not your primary care provider (PCP), if your PCP is located in our provider database this encounter information will be shared with them immediately following your visit. ? ?Consent: ?(Patient) Jenny Chan provided verbal consent for this virtual visit at the beginning of the encounter. ? ?Current Medications: ? ?Current Outpatient Medications:  ?  calcium carbonate (TUMS - DOSED IN MG ELEMENTAL CALCIUM) 500 MG chewable tablet, Chew 2 tablets by mouth daily as needed for indigestion or heartburn., Disp: , Rfl:  ?  clindamycin (CLEOCIN) 2 % vaginal cream, Place 1 Applicatorful vaginally at bedtime. for 7 days, Disp: 40 g, Rfl: 0 ?  metroNIDAZOLE (FLAGYL) 500 MG tablet, Take 1 tablet (500 mg total) by mouth 2 (two) times daily., Disp: 14 tablet, Rfl: 0 ?  neomycin-polymyxin-hydrocortisone (CORTISPORIN) 3.5-10000-1 ophthalmic suspension, Place 3 drops into both eyes 3 (three) times daily., Disp: 7.5 mL, Rfl: 0 ?  nitrofurantoin, macrocrystal-monohydrate, (MACROBID) 100 MG capsule, Take 1 capsule (100 mg total) by mouth 2 (two) times daily., Disp: 10 capsule, Rfl: 0 ?  Prenatal Vit-Fe Fumarate-FA (PRENATAL MULTIVITAMIN) TABS tablet, Take 1 tablet by mouth daily at 12 noon., Disp: , Rfl:   ? ?Medications ordered in this encounter:  ?No orders of the defined types were placed in this encounter. ?  ? ?*If you need refills on other medications prior to your next appointment, please contact your pharmacy* ? ?Follow-Up: ?Call back or seek an in-person evaluation if the symptoms worsen or if the condition fails to improve as anticipated. ? ?Other Instructions ?Keep an eye on your enlarged lymph node. It should go away with time.  ? ? ?If you have been instructed to have an in-person evaluation today at a local Urgent Care facility, please use the link below. It will take you to a list of all of our available [facility], including address, phone number and hours of operation. Please do not delay care.  ?[facility] ? ?If you or a family member do not have a primary care provider, use the link below to schedule a visit and establish care. When you choose a [facility] primary care physician or advanced practice provider, you gain a long-term partner in health. ?Find a Primary Care Provider ? ?Learn more about [facility]'s in-office and virtual care options: ?[facility] [place] [facility]  ?

## 2022-05-08 ENCOUNTER — Ambulatory Visit (HOSPITAL_COMMUNITY)
Admission: EM | Admit: 2022-05-08 | Discharge: 2022-05-08 | Disposition: A | Discharge status: Other Acute Inpatient Hospital | Payer: Medicaid Other | Attending: Psychiatry | Admitting: Psychiatry

## 2022-05-08 DIAGNOSIS — R45851 Suicidal ideations: Secondary | ICD-10-CM | POA: Diagnosis not present

## 2022-05-08 DIAGNOSIS — F32A Depression, unspecified: Secondary | ICD-10-CM | POA: Diagnosis not present

## 2022-05-08 DIAGNOSIS — Z20822 Contact with and (suspected) exposure to covid-19: Secondary | ICD-10-CM | POA: Diagnosis not present

## 2022-05-08 LAB — CBC WITH DIFFERENTIAL/PLATELET
Abs Immature Granulocytes: 0.01 10*3/uL (ref 0.00–0.07)
Basophils Absolute: 0 10*3/uL (ref 0.0–0.1)
Basophils Relative: 1 %
Eosinophils Absolute: 0.1 10*3/uL (ref 0.0–0.5)
Eosinophils Relative: 2 %
HCT: 40.1 % (ref 36.0–46.0)
Hemoglobin: 13.9 g/dL (ref 12.0–15.0)
Immature Granulocytes: 0 %
Lymphocytes Relative: 46 %
Lymphs Abs: 1.8 10*3/uL (ref 0.7–4.0)
MCH: 31.2 pg (ref 26.0–34.0)
MCHC: 34.7 g/dL (ref 30.0–36.0)
MCV: 89.9 fL (ref 80.0–100.0)
Monocytes Absolute: 0.4 10*3/uL (ref 0.1–1.0)
Monocytes Relative: 10 %
Neutro Abs: 1.6 10*3/uL — ABNORMAL LOW (ref 1.7–7.7)
Neutrophils Relative %: 41 %
Platelets: 205 10*3/uL (ref 150–400)
RBC: 4.46 MIL/uL (ref 3.87–5.11)
RDW: 12.3 % (ref 11.5–15.5)
WBC: 3.9 10*3/uL — ABNORMAL LOW (ref 4.0–10.5)
nRBC: 0 % (ref 0.0–0.2)

## 2022-05-08 LAB — POCT PREGNANCY, URINE: Preg Test, Ur: NEGATIVE

## 2022-05-08 LAB — HEMOGLOBIN A1C
Hgb A1c MFr Bld: 5.2 % (ref 4.8–5.6)
Mean Plasma Glucose: 102.54 mg/dL

## 2022-05-08 LAB — COMPREHENSIVE METABOLIC PANEL
ALT: 16 U/L (ref 0–44)
AST: 17 U/L (ref 15–41)
Albumin: 4.4 g/dL (ref 3.5–5.0)
Alkaline Phosphatase: 45 U/L (ref 38–126)
Anion gap: 9 (ref 5–15)
BUN: 8 mg/dL (ref 6–20)
CO2: 25 mmol/L (ref 22–32)
Calcium: 9.6 mg/dL (ref 8.9–10.3)
Chloride: 104 mmol/L (ref 98–111)
Creatinine, Ser: 0.68 mg/dL (ref 0.44–1.00)
GFR, Estimated: >60 mL/min (ref >60)
Glucose, Bld: 92 mg/dL (ref 70–99)
Potassium: 3.5 mmol/L (ref 3.5–5.1)
Sodium: 138 mmol/L (ref 135–145)
Total Bilirubin: 0.5 mg/dL (ref 0.3–1.2)
Total Protein: 8.1 g/dL (ref 6.5–8.1)

## 2022-05-08 LAB — POCT URINE DRUG SCREEN - MANUAL ENTRY (I-SCREEN)
POC Amphetamine UR: NOT DETECTED
POC Buprenorphine (BUP): NOT DETECTED
POC Cocaine UR: NOT DETECTED
POC Marijuana UR: NOT DETECTED
POC Methadone UR: NOT DETECTED
POC Methamphetamine UR: NOT DETECTED
POC Morphine: NOT DETECTED
POC Oxazepam (BZO): NOT DETECTED
POC Oxycodone UR: NOT DETECTED
POC Secobarbital (BAR): NOT DETECTED

## 2022-05-08 LAB — LIPID PANEL
Cholesterol: 181 mg/dL (ref 0–200)
HDL: 44 mg/dL (ref >40)
LDL Cholesterol: 128 mg/dL — ABNORMAL HIGH (ref 0–99)
Total CHOL/HDL Ratio: 4.1 RATIO
Triglycerides: 44 mg/dL (ref <150)
VLDL: 9 mg/dL (ref 0–40)

## 2022-05-08 LAB — RESP PANEL BY RT-PCR (FLU A&B, COVID) ARPGX2
Influenza A by PCR: NEGATIVE
Influenza B by PCR: NEGATIVE
SARS Coronavirus 2 by RT PCR: NEGATIVE

## 2022-05-08 LAB — TSH: TSH: 1.361 u[IU]/mL (ref 0.350–4.500)

## 2022-05-08 MED ORDER — MAGNESIUM HYDROXIDE 400 MG/5ML PO SUSP: 30.0000 mL | Freq: Every day | ORAL | Status: DC | PRN

## 2022-05-08 MED ORDER — HYDROXYZINE HCL 25 MG PO TABS: 25.0000 mg | ORAL_TABLET | Freq: Three times a day (TID) | ORAL | Status: DC | PRN

## 2022-05-08 MED ORDER — ACETAMINOPHEN 325 MG PO TABS: 650.0000 mg | ORAL_TABLET | Freq: Four times a day (QID) | ORAL | Status: DC | PRN

## 2022-05-08 MED ORDER — TRAZODONE HCL 50 MG PO TABS: 50.0000 mg | ORAL_TABLET | Freq: Every evening | ORAL | Status: DC | PRN

## 2022-05-08 MED ORDER — ALUM & MAG HYDROXIDE-SIMETH 200-200-20 MG/5ML PO SUSP: 30.0000 mL | ORAL | Status: DC | PRN

## 2022-05-08 NOTE — ED Notes (Signed)
Discharge/admission instructions provided and Pt stated understanding. Pt alert, orient and ambulatory prior to d/c from facility. Personal belongings returned from locker number 21. Safe transport called for transportation services. Pt escorted to the sally port. Paperwork given to driver. Safety maintained.

## 2022-05-08 NOTE — BH Assessment (Signed)
Comprehensive Clinical Assessment (CCA) Note  05/08/2022 Jenny Chan [id number]  Disposition:  Per Jenny Chimes, NP, Inpatient treatment is recommended   The patient demonstrates the following risk factors for suicide: Chronic risk factors for suicide include: psychiatric disorder of depression and previous self-harm hx of cutting . Acute risk factors for suicide include: loss (financial, interpersonal, professional). Protective factors for this patient include:  custody of her daughter . Considering these factors, the overall suicide risk at this point appears to be high. Patient is not appropriate for outpatient follow up.   AIMS    Flowsheet Row [facility]  AIMS Total Score 0      PHQ2-9    Flowsheet Row ED from 05/08/2022 in [facility]  PHQ-2 Total Score 5  PHQ-9 Total Score 16      Flowsheet Row ED from 05/08/2022 in [facility] ED from 02/25/2021 in [facility] RISK CATEGORY High Risk Error: Question 6 not populated       Chief Complaint:  Chief Complaint  Patient presents with   Depression   Suicidal   Visit Diagnosis: 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 Family/Friend  What Is the Reason for Your Visit/Call Today? Patient is presenting to the [facility] today with depression and suicidal ideation.  Patient states that she has been diagnosed with bipolar disorder, but states that she has been off her medications for "some years."  Patient states that she is unable to care for her daughter and she states that she has relinquished her custody of her daughter to the father and his family. She states that his family is moving to [place] and her daughter is going with them.  Patient states that she is  overwhelmed and has been thinking aboutoverdosing or slitting her wrists.  Patient has a prior suicide attempt by cutting.  Patient denies HI/Psychosis.  Patient denies any SA use.  Patient states that she has not been sleeping, but states that her appetite has been good.  Patient is urgent.  Patient states that she cut her wrists when she was 26 years old. The patient states there are many factors that led up to her suicide attempt. She states her parents were never married but lived together for number of years. She stated that throughout her whole childhood they fought constantly. She remembers always seeing her mother in tears. Finally her mother left her father and brought the family from [place] to [place]. Her father has not maintained contact with her and her siblings which disturbs the patient. While she was going through all this in the fifth grade she became suicidal and was thinking of taking an overdose but never did. Her mother did get her some help through counseling. She also saw a counselor in the seventh grade. In the past she had been cutting herself but had not been doing this recently. Patient was hospitalized in 2017 at [facility].   Patient states that she completed high school and states that she lives with her mother and is employed in a warehouse.  Patient states that she has a custody plan for her daughter and states that she is supposed to be living with her father, but he felt it was best for the child to stay with his mother.  Patient states  that she has some involvement and responsibility for her child, but states that she is just not capable of caring for her right now because of work and not having anyone to help her because her mother works as well.  Patient is alert and oriented.  Her mood is depressed and she is tearful.  Her judgment, insight and impulse control are impaired.  Her thoughts are organized and her memory is intact.  She does not appear to be responding to  any internal stimuli.  Patient's speech is slow and she is soft spoken.  Patient's eye contact is good.   How Long Has This Been Causing You Problems? 1 wk - 1 month  What Do You Feel Would Help You the Most Today? Treatment for Depression or other mood problem   Have You Recently Had Any Thoughts About Hurting Yourself? Yes  Are You Planning to Commit Suicide/Harm Yourself At This time? Yes   Have you Recently Had Thoughts About Hurting Someone Jenny Chan? No  Are You Planning to Harm Someone at This Time? No  Explanation: No data recorded  Have You Used Any Alcohol or Drugs in the Past 24 Hours? No  How Long Ago Did You Use Drugs or Alcohol? No data recorded What Did You Use and How Much? No data recorded  Do You Currently Have a Therapist/Psychiatrist? No  Name of Therapist/Psychiatrist: No data recorded  Have You Been Recently Discharged From Any Office Practice or Programs? No  Explanation of Discharge From Practice/Program: No data recorded    CCA Screening Triage Referral Assessment Type of Contact: No data recorded Telemedicine Service Delivery:   Is this Initial or Reassessment? No data recorded Date Telepsych consult ordered in CHL:  No data recorded Time Telepsych consult ordered in CHL:  No data recorded Location of Assessment: No data recorded Provider Location: No data recorded  Collateral Involvement: No data recorded  Does Patient Have a Court Appointed Legal Guardian? No data recorded Name and Contact of Legal Guardian: No data recorded If Minor and Not Living with Parent(s), Who has Custody? No data recorded Is CPS involved or ever been involved? No data recorded Is APS involved or ever been involved? No data recorded  Patient Determined To Be At Risk for Harm To Self or Others Based on Review of Patient Reported Information or Presenting Complaint? No data recorded Method: No data recorded Availability of Means: No data recorded Intent: No data  recorded Notification Required: No data recorded Additional Information for Danger to Others Potential: No data recorded Additional Comments for Danger to Others Potential: No data recorded Are There Guns or Other Weapons in Your Home? No data recorded Types of Guns/Weapons: No data recorded Are These Weapons Safely Secured?                            No data recorded Who Could Verify You Are Able To Have These Secured: No data recorded Do You Have any Outstanding Charges, Pending Court Dates, Parole/Probation? No data recorded Contacted To Inform of Risk of Harm To Self or Others: No data recorded   Does Patient Present under Involuntary Commitment? No data recorded IVC Papers Initial File Date: No data recorded  [place] of Residence: No data recorded  Patient Currently Receiving the Following Services: No data recorded  Determination of Need: Urgent (48 hours)   Options For Referral: Inpatient Hospitalization; Outpatient Therapy     CCA Biopsychosocial Patient Reported Schizophrenia/Schizoaffective  Diagnosis in Past: No   Strengths: Patient states that she has many ideas and she is creative   Mental Health Symptoms Depression:   Change in energy/activity; Hopelessness; Sleep (too much or little)   Duration of Depressive symptoms:  Duration of Depressive Symptoms: Greater than two weeks   Mania:   None   Anxiety:    Difficulty concentrating; Sleep; Worrying; Tension   Psychosis:   None   Duration of Psychotic symptoms:    Trauma:   None   Obsessions:   None   Compulsions:   None   Inattention:   None   Hyperactivity/Impulsivity:   None   Oppositional/Defiant Behaviors:   None   Emotional Irregularity:   Recurrent suicidal behaviors/gestures/threats   Other Mood/Personality Symptoms:   Patient has a depressed mood and flat affect    Mental Status Exam Appearance and self-care  Stature:   Average   Weight:   Average weight   Clothing:    Neat/clean   Grooming:   Normal   Cosmetic use:   Age appropriate   Posture/gait:   Normal   Motor activity:   Slowed   Sensorium  Attention:   Normal   Concentration:   Normal   Orientation:   Object; Person; Place; Situation; Time   Recall/memory:   Normal   Affect and Mood  Affect:   Depressed; Flat; Tearful   Mood:   Depressed   Relating  Eye contact:   None   Facial expression:   Depressed   Attitude toward examiner:   Cooperative   Thought and Language  Speech flow:  Slow   Thought content:   Appropriate to Mood and Circumstances   Preoccupation:   None   Hallucinations:   None   Organization:  No data recorded  [employer] of Knowledge:   Good   Intelligence:   Average   Abstraction:   Normal   Judgement:   Impaired   Reality Testing:   Realistic   Insight:   Good   Decision Making:   Impulsive   Social Functioning  Social Maturity:   Responsible   Social Judgement:   Normal   Stress  Stressors:   Other (Comment) (custody issues with her daughter)   Coping Ability:   Overwhelmed   Skill Deficits:   Decision making   Supports:   Family     Religion: Religion/Spirituality Are You A Religious Person?:  (not assessed)  Leisure/Recreation: Leisure / Recreation Do You Have Hobbies?: No  Exercise/Diet: Exercise/Diet Do You Exercise?: No Have You Gained or Lost A Significant Amount of Weight in the Past Six Months?: No Do You Follow a Special Diet?: No Do You Have Any Trouble Sleeping?: Yes Explanation of Sleeping Difficulties: Patient states that she has not been sleeping   CCA Employment/Education Employment/Work Situation: Employment / Work Situation Employment Situation: Employed Work Stressors: none reported Patient's Job has Been Impacted by Current Illness: No Has Patient ever Been in Equities trader?: Yes (Describe in comment) Did You Receive Any Psychiatric  Treatment/Services While in the [employer]?: No  Education: Education Is Patient Currently Attending School?: No Last Grade Completed: 12 Did You Product manager?: No Did You Have An Individualized Education Program (IIEP): No Did You Have Any Difficulty At School?: No Patient's Education Has Been Impacted by Current Illness: No   CCA Family/Childhood History Family and Relationship History: Family history Marital status: Single Does patient have children?: Yes How many children?: 1 How is patient's  relationship with their children?: 1 child age 30 who is living with the father  Childhood History:  Childhood History By whom was/is the patient raised?: Both parents Did patient suffer any verbal/emotional/physical/sexual abuse as a child?: No Did patient suffer from severe childhood neglect?: No Has patient ever been sexually abused/assaulted/raped as an adolescent or adult?: No Was the patient ever a victim of a crime or a disaster?: No Witnessed domestic violence?: No Has patient been affected by domestic violence as an adult?: No  Child/Adolescent Assessment:     CCA Substance Use Alcohol/Drug Use: Alcohol / Drug Use Pain Medications: See MAR Prescriptions: See MAR Over the Counter: See MAR History of alcohol / drug use?: No history of alcohol / drug abuse                         ASAM's:  Six Dimensions of Multidimensional Assessment  Dimension 1:  Acute Intoxication and/or Withdrawal Potential:      Dimension 2:  Biomedical Conditions and Complications:      Dimension 3:  Emotional, Behavioral, or Cognitive Conditions and Complications:     Dimension 4:  Readiness to Change:     Dimension 5:  Relapse, Continued use, or Continued Problem Potential:     Dimension 6:  Recovery/Living Environment:     ASAM Severity Score:    ASAM Recommended Level of Treatment:     Substance use Disorder (SUD)    Recommendations for Services/Supports/Treatments:     Discharge Disposition:    DSM5 Diagnoses: Patient Active Problem List   Diagnosis Date Noted   SVD (spontaneous vaginal delivery) 10/18/2018   Post term pregnancy at [redacted] weeks gestation 10/16/2018   Observed seizure-like activity (HCC) 07/26/2018   Severe episode of recurrent major depressive disorder, without psychotic features (HCC)      Referrals to Alternative Service(s): Referred to Alternative Service(s):   Place:   Date:   Time:    Referred to Alternative Service(s):   Place:   Date:   Time:    Referred to Alternative Service(s):   Place:   Date:   Time:    Referred to Alternative Service(s):   Place:   Date:   Time:     Alton Bouknight J Aundra Pung, LCAS

## 2022-05-08 NOTE — Progress Notes (Signed)
Pt was accepted to [facility] TODAY 05/08/22; Bed Assignment [facility]  Pt meets inpatient criteria per Margot Chimes, NP  Attending Physician will be Dr. Eilleen Kempf   Report can be called to: [phone]  Pt can arrive: BED IS READY NOW  Care Team notified: Margot Chimes, NP, and Sharion Dove, RN.  Kelton Pillar, LCSWA 05/08/2022 @ 1:03 PM

## 2022-05-08 NOTE — Progress Notes (Signed)
Patient is presenting to the [facility] today with depression and suicidal ideation. Patient states that shPatient is presenting to the [facility] today with depression and suicidal ideation. Patient states that she has been diagnosed with bipolar disorder, but states that she has been off her medications for "some years." Patient states that she is unable to care for her daughter and she states that she has relinquished her custody of her daughter to the father and his family. She states that his family is moving to [place] and her daughter is going with them. Patient states that she is overwhelmed and has been thinking aboutoverdosing or slitting her wrists. Patient has a prior suicide attempt by cutting. Patient denies HI/Psychosis. Patient denies any SA use. Patient states that she has not been sleeping, but states that her appetite has been good. Patient is urgent.e has been diagnosed with bipolar disorder, but states that she has been off her medications for "some years." Patient states that she is unable to care for her daughter and she states that she has relinquished her custody of her daughter to the father and his family. She states that his family is moving to [place] and her daughter is going with them. Patient states that she is overwhelmed and has been thinking aboutoverdosing or slitting her wrists. Patient has a prior suicide attempt by cutting. Patient denies HI/Psychosis. Patient denies any SA use. Patient states that she has not been sleeping, but states that her appetite has been good. Patient is urgent.

## 2022-05-08 NOTE — ED Notes (Signed)
Report called to Alonna Buckler RN at [facility]. Safe transport called also. EMTALA , H&P, eMAR and lab results printed and placed in envelope for transfer.

## 2022-05-08 NOTE — Progress Notes (Signed)
Pt is under review at [facility]. Nursing notified Sharion Dove, RN.   Maryjean Ka, MSW, [facility] 05/08/2022 12:58 PM

## 2022-05-08 NOTE — Progress Notes (Signed)
Inpatient Behavioral Placement  Pt meets inpatient criteria per Margot Chimes, NP. There are no available beds at [facility] per [facility], RN.  Referral was sent to the following facilities;   Destination Service Provider Address Phone Fax  [facility]  [address] [place] [address] [phone]  [facility]  [address] [place], [place] [address] [phone]  [facility]  [address] [place] [address] [phone]  [facility]  [address] [place], [place] [address] [phone]  [facility]  [address] [place] [address] [phone]  [facility]  [address] [place] [address] [phone]  [facility]  [address], [place] [address] [phone]  [facility]  [address], [place] [address] [phone]  [facility]  [address] [place] [address] [phone]  [facility]  [address] [place] [address] [phone]  [facility]  [address] [place] [address] [phone]  [facility]  [address] [place] [address] [phone]  [facility]  [address] [place] [address] [phone]  [facility]  [address], [place] [address] [phone]  [facility]  [address], [place] [address] [phone]  [facility]  [address] [place] [address] [phone]    Situation ongoing,  CSW will follow  up.   Maryjean Ka, MSW, LCSWA 05/08/2022  @ 12:52 PM

## 2022-05-08 NOTE — ED Notes (Signed)
Patient cooperative on unit with sad and tearful affect. Patient denies SI,HI, and A/V/H at this time although states has been having suicidal thoughts these past few days. Patient is able to contract for safety on unit. Patient oriented to unit and denies any pain/discomfort at this time.

## 2022-05-08 NOTE — Discharge Instructions (Signed)
You are being transferred to [facility] for inpatient psychiatric admission.

## 2022-05-08 NOTE — ED Provider Notes (Addendum)
Behavioral Health Urgent Care Medical Screening Exam  Patient Name: Jenny Chan MRN: [id number] Date of Evaluation: 05/08/22 Chief Complaint:   Diagnosis:  Final diagnoses:  Depression, unspecified depression type   History of Present illness:  Pt presents [facility] to [facility] behavioral health for walk-in assessment.  Pt is accompanied by her mother, Catina. Pt is assessed face-to-face by nurse practitioner.   Jenny Chan, 26 y.o., female patient seen face to face by this provider, consulted with Dr. Lucianne Muss; and chart reviewed on 05/08/22.  On evaluation Jenny Chan reports she is presenting to this facility today for "suicidal thoughts". Pt is tearful on assessment. She reports she has been feeling depressed since her pregnancy in 2020, which worsened followed birth in 2020. She states that 2 days ago she had an argument with her sister about her daughter, who is living with her daughter's father's mother. She states that her sister called her selfish for her parenting decisions. She reports suicidal ideation with plans to slit her wrists or overdose. She states additional stressor for her is that her daughter's father's mother is asking her to sign over custody, which she is willing to do. Her daughter's father's mother will also be moving to [place] and planning on taking her daughter with her.   Pt denies homicidal or violent ideation. She denies auditory visual hallucinations or paranoia.   Pt reports poor appetite, is eating once/day. She reports poor sleep, sleeping 5 to 6 hours/night.   Pt reports history of nonsuicidal self injurious behavior, cutting, which she stopped in 2017. She reports history of 1 suicide attempt, cutting her wrists. Chart review reveals pt was seen at [facility] on 10/05/15 following suicide attempt, during which she took half a bottle of dayquil, half a bottle of children's tylenol and cut her wrist, with right wrist laceration with tendon  laceration. Pt reports history of 1 inpatient psychiatric hospitalization following suicide attempt at [facility] from 10/06/15-10/10/15.  Pt denies alcohol, marijuana, crack/cocaine, opioid, nicotine, other substance use.   Pt is not currently connected with counseling or medication management.  Pt is living with her mother, and 2 brothers.  Pt is working full time at [employer]. She reports she works from 7AM-3:30PM.  Pt states highest level of education is high school diploma.  Pt denies knowledge of family psychiatric history.  Pt gave verbal consent for mother to join assessment. Collateral from pt's mother, who denies knowledge of suicidal ideation or plans. Discussed recommendation for inpatient psychiatric admission. Pt's mother states she believes pt needs inpatient psychiatric admission. Pt agrees for voluntary inpatient psychiatric admission. Pt has been accepted to [facility] for inpatient psychiatric admission.  Psychiatric Specialty Exam  Presentation  General Appearance:Appropriate for Environment; Casual; Fairly Groomed  Eye Contact:Fair  Speech:Clear and Coherent; Normal Rate  Speech Volume:Decreased  Handedness:No data recorded  Mood and Affect  Mood:Depressed  Affect:Tearful   Thought Process  Thought Processes:Coherent; Goal Directed; Linear  Descriptions of Associations:Intact  Orientation:Full (Time, Place and Person)  Thought Content:Logical  Diagnosis of Schizophrenia or Schizoaffective disorder in past: No   Hallucinations:None  Ideas of Reference:None  Suicidal Thoughts:Yes, Active With Plan  Homicidal Thoughts:No   Sensorium  Memory:Immediate Good; Recent Good; Remote Good  Judgment:Fair  Insight:Fair   Executive Functions  Concentration:Good  Attention Span:Good  Recall:Good  Fund of Knowledge:Good  Language:Good   Psychomotor Activity  Psychomotor Activity:Normal   Assets  Assets:Communication Skills; Desire for  Improvement; Financial Resources/Insurance; Housing; Social Support   Sleep  Sleep:Poor  Number of hours: No data recorded  Nutritional Assessment (For OBS and FBC admissions only) Has the patient had a weight loss or gain of 10 pounds or more in the last 3 months?: -- (Unknown to pt) Has the patient had a decrease in food intake/or appetite?: Yes Does the patient have dental problems?: No Does the patient have eating habits or behaviors that may be indicators of an eating disorder including binging or inducing vomiting?: No Has the patient recently lost weight without trying?: 2.0 Has the patient been eating poorly because of a decreased appetite?: 1 Malnutrition Screening Tool Score: 3   Physical Exam: Physical Exam Cardiovascular:     Rate and Rhythm: Normal rate.  Pulmonary:     Effort: Pulmonary effort is normal.  Neurological:     Mental Status: She is alert and oriented to person, place, and time.  Psychiatric:        Attention and Perception: Attention and perception normal.        Mood and Affect: Mood is depressed. Affect is tearful.        Behavior: Behavior normal. Behavior is cooperative.        Thought Content: Thought content includes suicidal ideation. Thought content includes suicidal plan.        Cognition and Memory: Cognition and memory normal.    Review of Systems  Constitutional:  Negative for chills and fever.  Respiratory:  Negative for shortness of breath.   Cardiovascular:  Negative for chest pain and palpitations.  Gastrointestinal:  Negative for abdominal pain.  Neurological:  Negative for headaches.  Psychiatric/Behavioral:  Positive for depression and suicidal ideas.    Blood pressure 109/74, pulse 74, temperature 98.4 F (36.9 C), temperature source Oral, resp. rate 18, height 5\' 1"  (1.549 m), weight 138 lb (62.6 kg), SpO2 100 %, unknown if currently breastfeeding. Body mass index is 26.07 kg/m.  Musculoskeletal: Strength & Muscle Tone:  within normal limits Gait & Station: normal Patient leans: N/A  [facility] MSE Discharge Disposition for Follow up and Recommendations: Pt has been accepted to [facility] for inpatient psychiatric admission.  [facility], NP 05/08/2022, 1:17 PM

## 2022-12-31 ENCOUNTER — Encounter: Payer: Self-pay | Admitting: Nurse Practitioner

## 2022-12-31 ENCOUNTER — Ambulatory Visit (INDEPENDENT_AMBULATORY_CARE_PROVIDER_SITE_OTHER): Payer: Medicaid Other | Admitting: Nurse Practitioner

## 2022-12-31 ENCOUNTER — Other Ambulatory Visit (HOSPITAL_COMMUNITY)
Admission: RE | Admit: 2022-12-31 | Discharge: 2022-12-31 | Disposition: A | Discharge status: Home / Self Care | Payer: Medicaid Other | Source: Ambulatory Visit | Attending: Internal Medicine | Admitting: Internal Medicine

## 2022-12-31 VITALS — BP 97/63 | HR 87 | Temp 97.9°F | Ht 61.0 in | Wt 131.2 lb

## 2022-12-31 DIAGNOSIS — H539 Unspecified visual disturbance: Secondary | ICD-10-CM | POA: Diagnosis not present

## 2022-12-31 DIAGNOSIS — R413 Other amnesia: Secondary | ICD-10-CM

## 2022-12-31 DIAGNOSIS — Z124 Encounter for screening for malignant neoplasm of cervix: Secondary | ICD-10-CM | POA: Insufficient documentation

## 2022-12-31 DIAGNOSIS — Z87898 Personal history of other specified conditions: Secondary | ICD-10-CM

## 2022-12-31 DIAGNOSIS — Z0000 Encounter for general adult medical examination without abnormal findings: Secondary | ICD-10-CM

## 2022-12-31 DIAGNOSIS — Z13220 Encounter for screening for lipoid disorders: Secondary | ICD-10-CM

## 2022-12-31 DIAGNOSIS — R2689 Other abnormalities of gait and mobility: Secondary | ICD-10-CM

## 2022-12-31 LAB — OB RESULTS CONSOLE GC/CHLAMYDIA: Chlamydia: NEGATIVE

## 2022-12-31 NOTE — Assessment & Plan Note (Signed)
-   Ambulatory referral to Neurology  2. Memory loss  - Ambulatory referral to Neurology  3. Vision changes  - Ambulatory referral to Neurology - Ambulatory referral to Ophthalmology  4. Balance problem  - Ambulatory referral to Neurology  5. Cervical cancer screening  - Cytology - PAP([facility])  6. Lipid screening  - Lipid Panel  7. Routine adult health maintenance  - CBC - Comprehensive metabolic panel  Follow up:  Follow up in 6 months

## 2022-12-31 NOTE — Patient Instructions (Signed)
1. History of seizures  - Ambulatory referral to Neurology  2. Memory loss  - Ambulatory referral to Neurology  3. Vision changes  - Ambulatory referral to Neurology - Ambulatory referral to Ophthalmology  4. Balance problem  - Ambulatory referral to Neurology  5. Cervical cancer screening  - Cytology - PAP([place])  6. Lipid screening  - Lipid Panel  7. Routine adult health maintenance  - CBC - Comprehensive metabolic panel  Follow up:  Follow up in 6 months

## 2022-12-31 NOTE — Progress Notes (Signed)
@Patient  ID: Jenny Chan, female    DOB: [birth year], 27 y.o.   MRN: [id number]  Chief Complaint  Patient presents with   Establish Care    Non fasting     Referring provider: No ref. provider found   HPI  27 year old female with history of seizures, depression, and bipolar  Patient presents today to establish care and for complete physical.  Patient is requesting a referral to neurology.  She states that she does have a history of seizures and is currently having blurred vision, significant memory loss and concentration issues, balance issues.  She states that she feels like she is confused when people give her directions at work.  Patient does have a history of bipolar disorder and depression and is not currently seeing a psychiatrist.  We will also do Pap smear today in office. Denies f/c/s, n/v/d, hemoptysis, PND, leg swelling Denies chest pain or edema     Allergies  Allergen Reactions   Metronidazole Rash    There is no immunization history for the selected administration types on file for this patient.  Past Medical History:  Diagnosis Date   Seizure-like activity (HCC)     Tobacco History: Social History   Tobacco Use  Smoking Status Never  Smokeless Tobacco Never   Counseling given: Not Answered   Outpatient Encounter Medications as of 12/31/2022  Medication Sig   lamoTRIgine (LAMICTAL) 100 MG tablet Take 100 mg by mouth 2 (two) times daily.   OVER THE COUNTER MEDICATION Take 15 mLs by mouth daily. Floradix Liquid Iron and Vitamin Formula (Patient not taking: Reported on 12/31/2022)   No facility-administered encounter medications on file as of 12/31/2022.     Review of Systems  Review of Systems  Constitutional: Negative.   HENT: Negative.    Cardiovascular: Negative.   Gastrointestinal: Negative.   Allergic/Immunologic: Negative.   Neurological: Negative.   Psychiatric/Behavioral: Negative.         Physical Exam  BP 97/63   Pulse 87    Temp 97.9 F (36.6 C)   Ht 5\' 1"  (1.549 m)   Wt 131 lb 3.2 oz (59.5 kg)   LMP  (LMP Unknown)   SpO2 100%   BMI 24.79 kg/m   Wt Readings from Last 5 Encounters:  12/31/22 131 lb 3.2 oz (59.5 kg)  10/16/18 143 lb 1.3 oz (64.9 kg)  10/16/18 143 lb (64.9 kg)  07/26/18 133 lb (60.3 kg)     Physical Exam Vitals and nursing note reviewed. Exam conducted with a chaperone present.  Constitutional:      General: She is not in acute distress.    Appearance: She is well-developed.  Cardiovascular:     Rate and Rhythm: Normal rate and regular rhythm.  Pulmonary:     Effort: Pulmonary effort is normal.     Breath sounds: Normal breath sounds.  Genitourinary:    General: Normal vulva.     Vagina: Normal.     Cervix: Normal.     Uterus: Normal.      Adnexa: Right adnexa normal and left adnexa normal.  Neurological:     Mental Status: She is alert and oriented to person, place, and time.      Lab Results:  CBC    Component Value Date/Time   WBC 3.9 (L) 05/08/2022 1132   RBC 4.46 05/08/2022 1132   HGB 13.9 05/08/2022 1132   HCT 40.1 05/08/2022 1132   PLT 205 05/08/2022 1132   MCV 89.9 05/08/2022  1132   MCH 31.2 05/08/2022 1132   MCHC 34.7 05/08/2022 1132   RDW 12.3 05/08/2022 1132   LYMPHSABS 1.8 05/08/2022 1132   MONOABS 0.4 05/08/2022 1132   EOSABS 0.1 05/08/2022 1132   BASOSABS 0.0 05/08/2022 1132    BMET    Component Value Date/Time   NA 138 05/08/2022 1132   K 3.5 05/08/2022 1132   CL 104 05/08/2022 1132   CO2 25 05/08/2022 1132   GLUCOSE 92 05/08/2022 1132   BUN 8 05/08/2022 1132   CREATININE 0.68 05/08/2022 1132   CALCIUM 9.6 05/08/2022 1132   GFRNONAA >60 05/08/2022 1132   GFRAA NOT CALCULATED 10/05/2015 1122      Assessment & Plan:   History of seizures - Ambulatory referral to Neurology  2. Memory loss  - Ambulatory referral to Neurology  3. Vision changes  - Ambulatory referral to Neurology - Ambulatory referral to Ophthalmology  4.  Balance problem  - Ambulatory referral to Neurology  5. Cervical cancer screening  - Cytology - PAP([place])  6. Lipid screening  - Lipid Panel  7. Routine adult health maintenance  - CBC - Comprehensive metabolic panel  Follow up:  Follow up in 6 months     Ivonne Andrew, NP 12/31/2022

## 2023-01-07 ENCOUNTER — Encounter: Payer: Self-pay | Admitting: Nurse Practitioner

## 2023-01-07 ENCOUNTER — Telehealth: Payer: Medicaid Other | Admitting: Nurse Practitioner

## 2023-01-07 DIAGNOSIS — R6889 Other general symptoms and signs: Secondary | ICD-10-CM

## 2023-01-07 LAB — CYTOLOGY - PAP
Chlamydia: NEGATIVE
Comment: NEGATIVE
Comment: NORMAL
Neisseria Gonorrhea: NEGATIVE

## 2023-01-07 NOTE — Progress Notes (Signed)
Virtual Visit Consent   Jenny Chan, you are scheduled for a virtual visit with a  provider today. Just as with appointments in the office, your consent must be obtained to participate. Your consent will be active for this visit and any virtual visit you may have with one of our providers in the next 365 days. If you have a MyChart account, a copy of this consent can be sent to you electronically.  As this is a virtual visit, video technology does not allow for your provider to perform a traditional examination. This may limit your provider's ability to fully assess your condition. If your provider identifies any concerns that need to be evaluated in person or the need to arrange testing (such as labs, EKG, etc.), we will make arrangements to do so. Although advances in technology are sophisticated, we cannot ensure that it will always work on either your end or our end. If the connection with a video visit is poor, the visit may have to be switched to a telephone visit. With either a video or telephone visit, we are not always able to ensure that we have a secure connection.  By engaging in this virtual visit, you consent to the provision of healthcare and authorize for your insurance to be billed (if applicable) for the services provided during this visit. Depending on your insurance coverage, you may receive a charge related to this service.  I need to obtain your verbal consent now. Are you willing to proceed with your visit today? Jenny Chan has provided verbal consent on 01/07/2023 for a virtual visit (video or telephone). Viviano Simas, FNP  Date: 01/07/2023 1:26 PM  Virtual Visit via Video Note   I, Viviano Simas, connected with  Jenny Chan  ([id number], November 12, 1995) on 01/07/23 at  1:30 PM EDT by a video-enabled telemedicine application and verified that I am speaking with the correct person using two identifiers.  Location: Patient: Virtual Visit Location Patient:  Home Provider: Virtual Visit Location Provider: Home Office   I discussed the limitations of evaluation and management by telemedicine and the availability of in person appointments. The patient expressed understanding and agreed to proceed.    History of Present Illness: Jenny Chan is a 27 y.o. who identifies as a female who was assigned female at birth, and is being seen today with complaints of ongoing dizziness and lightheadedness.  She has also had blurred vision  She has felt off balance She has felt she is forgetting things as well   She feels she has always been "forgetful" but feels for the past 3 months it has been notably worse  She has felt the most changes over the past month   She was started on Lamictal two year ago  This is the only prescription medication she is currently taking  Denies any other medications that were recently stopped   She feels her "focus" improved with the Lamictal, but she feels that it is not helping anymore   She was hospitalized in December for Depression   She recently established care with Angus Seller as her primary care  She was referred to a neurologist and ophthalmology after that visit for similar complaints  Symptoms today are unchanged from last week's appointment   Her Neurology appointment is scheduled for August     Problems:  Patient Active Problem List   Diagnosis Date Noted   History of seizures 12/31/2022   SVD (spontaneous vaginal delivery) 10/18/2018   Post term pregnancy at  [redacted] weeks gestation 10/16/2018   Observed seizure-like activity (HCC) 07/26/2018   Severe episode of recurrent major depressive disorder, without psychotic features (HCC)     Allergies:  Allergies  Allergen Reactions   Metronidazole Rash   Medications:  Current Outpatient Medications:    lamoTRIgine (LAMICTAL) 100 MG tablet, Take 100 mg by mouth 2 (two) times daily., Disp: , Rfl:    OVER THE COUNTER MEDICATION, Take 15 mLs by mouth  daily. Floradix Liquid Iron and Vitamin Formula (Patient not taking: Reported on 12/31/2022), Disp: , Rfl:   Observations/Objective: Patient is well-developed, well-nourished in no acute distress.  Resting comfortably  at home.  Head is normocephalic, atraumatic.  No labored breathing.  Speech is clear and coherent with logical content.  Patient is alert and oriented at baseline.    Assessment and Plan: 1. Forgetfulness Patient has addressed this with primary care, is awaiting Neurology referral  Discussed when she needs immediate intervention, when to go to ED  Advised on [facility] in [place] as well if she feels symptoms start to effect mood  She is appropriate and in no acute distress during VV today  Aware of resources, will follow up with PCP to make aware of the wait time for Neurology and will seek immediate care for acutely worsening symptoms in ED as needed as discussed       Follow Up Instructions: I discussed the assessment and treatment plan with the patient. The patient was provided an opportunity to ask questions and all were answered. The patient agreed with the plan and demonstrated an understanding of the instructions.  A copy of instructions were sent to the patient via MyChart unless otherwise noted below.    The patient was advised to call back or seek an in-person evaluation if the symptoms worsen or if the condition fails to improve as anticipated.  Time:  I spent 15 minutes with the patient via telehealth technology discussing the above problems/concerns.    Viviano Simas, FNP

## 2023-01-07 NOTE — Patient Instructions (Addendum)
[email or web address]  [address] [place], [place] [address] HelpLine: [phone] or [phone]

## 2023-01-09 ENCOUNTER — Ambulatory Visit: Payer: Medicaid Other | Admitting: Nurse Practitioner

## 2023-02-12 ENCOUNTER — Other Ambulatory Visit: Payer: Self-pay

## 2023-02-12 ENCOUNTER — Emergency Department (EMERGENCY_DEPARTMENT_HOSPITAL)
Admission: EM | Admit: 2023-02-12 | Discharge: 2023-02-13 | Disposition: A | Discharge status: Home / Self Care | Payer: Medicaid Other | Source: Home / Self Care | Attending: Emergency Medicine | Admitting: Emergency Medicine

## 2023-02-12 ENCOUNTER — Encounter (HOSPITAL_COMMUNITY): Payer: Self-pay

## 2023-02-12 DIAGNOSIS — S41119A Laceration without foreign body of unspecified upper arm, initial encounter: Secondary | ICD-10-CM

## 2023-02-12 DIAGNOSIS — S41111A Laceration without foreign body of right upper arm, initial encounter: Secondary | ICD-10-CM | POA: Insufficient documentation

## 2023-02-12 DIAGNOSIS — X838XXA Intentional self-harm by other specified means, initial encounter: Secondary | ICD-10-CM | POA: Insufficient documentation

## 2023-02-12 DIAGNOSIS — S41112A Laceration without foreign body of left upper arm, initial encounter: Secondary | ICD-10-CM | POA: Insufficient documentation

## 2023-02-12 DIAGNOSIS — T1491XA Suicide attempt, initial encounter: Secondary | ICD-10-CM | POA: Insufficient documentation

## 2023-02-12 DIAGNOSIS — F332 Major depressive disorder, recurrent severe without psychotic features: Secondary | ICD-10-CM | POA: Diagnosis present

## 2023-02-12 LAB — CBC WITH DIFFERENTIAL/PLATELET
Abs Immature Granulocytes: 0.02 10*3/uL (ref 0.00–0.07)
Basophils Absolute: 0 10*3/uL (ref 0.0–0.1)
Basophils Relative: 0 %
Eosinophils Absolute: 0 10*3/uL (ref 0.0–0.5)
Eosinophils Relative: 0 %
HCT: 39.4 % (ref 36.0–46.0)
Hemoglobin: 13.4 g/dL (ref 12.0–15.0)
Immature Granulocytes: 0 %
Lymphocytes Relative: 40 %
Lymphs Abs: 2.7 10*3/uL (ref 0.7–4.0)
MCH: 30.7 pg (ref 26.0–34.0)
MCHC: 34 g/dL (ref 30.0–36.0)
MCV: 90.2 fL (ref 80.0–100.0)
Monocytes Absolute: 0.7 10*3/uL (ref 0.1–1.0)
Monocytes Relative: 10 %
Neutro Abs: 3.3 10*3/uL (ref 1.7–7.7)
Neutrophils Relative %: 50 %
Platelets: 325 10*3/uL (ref 150–400)
RBC: 4.37 MIL/uL (ref 3.87–5.11)
RDW: 11.8 % (ref 11.5–15.5)
WBC: 6.8 10*3/uL (ref 4.0–10.5)
nRBC: 0 % (ref 0.0–0.2)

## 2023-02-12 LAB — COMPREHENSIVE METABOLIC PANEL
ALT: 16 U/L (ref 0–44)
AST: 22 U/L (ref 15–41)
Albumin: 4 g/dL (ref 3.5–5.0)
Alkaline Phosphatase: 46 U/L (ref 38–126)
Anion gap: 14 (ref 5–15)
BUN: 9 mg/dL (ref 6–20)
CO2: 18 mmol/L — ABNORMAL LOW (ref 22–32)
Calcium: 8.8 mg/dL — ABNORMAL LOW (ref 8.9–10.3)
Chloride: 104 mmol/L (ref 98–111)
Creatinine, Ser: 1.02 mg/dL — ABNORMAL HIGH (ref 0.44–1.00)
GFR, Estimated: >60 mL/min (ref >60)
Glucose, Bld: 116 mg/dL — ABNORMAL HIGH (ref 70–99)
Potassium: 2.7 mmol/L — CL (ref 3.5–5.1)
Sodium: 136 mmol/L (ref 135–145)
Total Bilirubin: 0.2 mg/dL — ABNORMAL LOW (ref 0.3–1.2)
Total Protein: 7.9 g/dL (ref 6.5–8.1)

## 2023-02-12 LAB — I-STAT CHEM 8, ED
BUN: 7 mg/dL (ref 6–20)
Calcium, Ion: 1.13 mmol/L — ABNORMAL LOW (ref 1.15–1.40)
Chloride: 103 mmol/L (ref 98–111)
Creatinine, Ser: 1 mg/dL (ref 0.44–1.00)
Glucose, Bld: 109 mg/dL — ABNORMAL HIGH (ref 70–99)
HCT: 42 % (ref 36.0–46.0)
Hemoglobin: 14.3 g/dL (ref 12.0–15.0)
Potassium: 3.2 mmol/L — ABNORMAL LOW (ref 3.5–5.1)
Sodium: 141 mmol/L (ref 135–145)
TCO2: 21 mmol/L — ABNORMAL LOW (ref 22–32)

## 2023-02-12 LAB — BLOOD GAS, VENOUS
Acid-base deficit: 6.2 mmol/L — ABNORMAL HIGH (ref 0.0–2.0)
Bicarbonate: 19.6 mmol/L — ABNORMAL LOW (ref 20.0–28.0)
O2 Saturation: 99.5 %
Patient temperature: 37
pCO2, Ven: 39 mmHg — ABNORMAL LOW (ref 44–60)
pH, Ven: 7.31 (ref 7.25–7.43)
pO2, Ven: 96 mmHg — ABNORMAL HIGH (ref 32–45)

## 2023-02-12 LAB — I-STAT BETA HCG BLOOD, ED (MC, WL, AP ONLY): I-stat hCG, quantitative: <5 m[IU]/mL (ref <5)

## 2023-02-12 LAB — SALICYLATE LEVEL: Salicylate Lvl: <7 mg/dL — ABNORMAL LOW (ref 7.0–30.0)

## 2023-02-12 LAB — ACETAMINOPHEN LEVEL: Acetaminophen (Tylenol), Serum: 20 ug/mL (ref 10–30)

## 2023-02-12 MED ORDER — MIDAZOLAM HCL 2 MG/2ML IJ SOLN
2.0000 mg | Freq: Once | INTRAMUSCULAR | Status: AC
  Administered 2023-02-12: 2 mg via INTRAVENOUS
  Filled 2023-02-12: qty 2

## 2023-02-12 MED ORDER — LIDOCAINE HCL (PF) 1 % IJ SOLN
30.0000 mL | Freq: Once | INTRAMUSCULAR | Status: AC
  Administered 2023-02-12: 30 mL
  Filled 2023-02-12: qty 30

## 2023-02-12 MED ORDER — MAGNESIUM SULFATE 50 % IJ SOLN
2.0000 g | Freq: Once | INTRAVENOUS | Status: AC
  Administered 2023-02-12: 2 g via INTRAVENOUS
  Filled 2023-02-12: qty 4

## 2023-02-12 MED ORDER — LACTATED RINGERS IV BOLUS
1000.0000 mL | Freq: Once | INTRAVENOUS | Status: AC
  Administered 2023-02-12: 1000 mL via INTRAVENOUS

## 2023-02-12 MED ORDER — POTASSIUM CHLORIDE 10 MEQ/100ML IV SOLN
10.0000 meq | Freq: Once | INTRAVENOUS | Status: AC
  Administered 2023-02-12: 10 meq via INTRAVENOUS
  Filled 2023-02-12: qty 100

## 2023-02-12 NOTE — ED Notes (Signed)
Patient having moments of severe jerking all over the bed. Patient not post ictal after each event. Does has periods of apnea, and will then take a deep breath. Patient appears to be oriented when awake. Answering questions appropriately, and stating, "just let me die, I dont want to be here anymore."

## 2023-02-12 NOTE — ED Triage Notes (Signed)
Patient brought in by EMS due to suicidal ideations with attempt. Patient has superficial lacerations to bilateral arms. EMS was called by family due to patient being unresponsive. Pt also took an unknown amount of lamictal. Prescription bottle had 180 pills that were prescribed in May. Dosage unknown. Patient continues to yell that she wants to die and to let her die.

## 2023-02-12 NOTE — ED Notes (Signed)
Date and time results received: 02/12/23 8:15 PM  (use smartphrase ".now" to insert current time)  Test: Potassium Critical Value: 2.7  Name of Provider Notified: [facility]  Orders Received? Or Actions Taken?: Orders Received - See Orders for details

## 2023-02-12 NOTE — ED Notes (Signed)
Covered superficial wounds with gauze and kerlex. Pt tolerated well. Pt currently crying due to not being successful with her SI attempt, stating "it didn't work, I didn't die"

## 2023-02-12 NOTE — ED Provider Notes (Signed)
[facility] EMERGENCY DEPARTMENT AT [facility] Provider Note   CSN: [id number] Arrival date & time: 02/12/23  1802     History Chief Complaint  Patient presents with   Suicide Attempt    HPI Jenny Chan is a 27 y.o. female presenting for multiple chief complaints.  Endorsing the setting of suicide attempt with 100 mg tablets of lamotrigine.  Approximately 100 tablets were still inside the bottle. Multiple superficial lacerations.  History of similar history of psychiatric disease.  Patient's recorded medical, surgical, social, medication list and allergies were reviewed in the Snapshot window as part of the initial history.   Review of Systems   Review of Systems  Unable to perform ROS: Psychiatric disorder    Physical Exam Updated Vital Signs BP (!) 103/51   Pulse 96   Temp 97.7 F (36.5 C)   Resp (!) 21   Ht 5\' 1"  (1.549 m)   Wt 59.5 kg   SpO2 100%   BMI 24.79 kg/m  Physical Exam Vitals and nursing note reviewed.  Constitutional:      General: She is not in acute distress.    Appearance: She is well-developed.  HENT:     Head: Normocephalic and atraumatic.  Eyes:     Conjunctiva/sclera: Conjunctivae normal.  Cardiovascular:     Rate and Rhythm: Normal rate and regular rhythm.     Heart sounds: No murmur heard. Pulmonary:     Effort: Pulmonary effort is normal. No respiratory distress.     Breath sounds: Normal breath sounds.  Abdominal:     General: There is no distension.     Palpations: Abdomen is soft.     Tenderness: There is no abdominal tenderness. There is no right CVA tenderness or left CVA tenderness.  Musculoskeletal:        General: Deformity present. No swelling or tenderness. Normal range of motion.     Cervical back: Neck supple.  Skin:    General: Skin is warm and dry.  Neurological:     General: No focal deficit present.     Mental Status: She is alert and oriented to person, place, and time. Mental status is at baseline.      Cranial Nerves: No cranial nerve deficit.      ED Course/ Medical Decision Making/ A&P Clinical Course as of 02/12/23 2258  Thu Feb 12, 2023  1815 Poison control [CC]  1823 Consulted poison control.  They recommended supportive care, come ingestant testing, 6-hour telemetry observation and reassessment of development of interval symptoms. Endorsed that there is a risk for CNS or cardiac conduction abnormalities. [CC]  1824   Initial EKG reassuring. [CC]    Clinical Course User Index [CC] Glyn Ade, MD    Procedures .Critical Care  Performed by: Glyn Ade, MD Authorized by: Glyn Ade, MD   Critical care provider statement:    Critical care time (minutes):  90   Critical care was necessary to treat or prevent imminent or life-threatening deterioration of the following conditions:  Toxidrome   Critical care was time spent personally by me on the following activities:  Development of treatment plan with patient or surrogate, discussions with consultants, evaluation of patient's response to treatment, examination of patient, ordering and review of laboratory studies, ordering and review of radiographic studies, ordering and performing treatments and interventions, pulse oximetry, re-evaluation of patient's condition and review of old charts   Care discussed with: accepting provider at another facility   .Marland KitchenLaceration Repair  Date/Time:  02/12/2023 10:54 PM  Performed by: Glyn Ade, MD Authorized by: Glyn Ade, MD   Consent:    Consent obtained:  Written   Consent given by:  Patient   Risks, benefits, and alternatives were discussed: yes     Risks discussed:  Infection, pain, poor wound healing and poor cosmetic result Laceration details:    Length (cm):  35 Pre-procedure details:    Preparation:  Patient was prepped and draped in usual sterile fashion Exploration:    Limited defect created (wound extended): no   Treatment:    Area  cleansed with:  Saline   Amount of cleaning:  Extensive   Visualized foreign bodies/material removed: no     Debridement:  None   Undermining:  None   Scar revision: no     Layers/structures repaired:  Deep subcutaneous Deep subcutaneous:    Suture technique:  Simple interrupted Skin repair:    Repair method:  Sutures   Suture size:  5-0   Suture material:  Nylon   Suture technique:  Running locked and simple interrupted   Number of sutures: 13 simple interrupted. 2 Running locked. Approximation:    Approximation:  Close Repair type:    Repair type:  Intermediate Post-procedure details:    Dressing:  Open (no dressing)   Procedure completion:  Tolerated    Medications Ordered in ED Medications  magnesium sulfate 2 g in dextrose 5 % 250 mL (2 g Intravenous New Bag/Given 02/12/23 2101)  lidocaine (PF) (XYLOCAINE) 1 % injection 30 mL (has no administration in time range)  lactated ringers bolus 1,000 mL (0 mLs Intravenous Stopped 02/12/23 2102)  midazolam (VERSED) injection 2 mg (2 mg Intravenous Given 02/12/23 1901)  potassium chloride 10 mEq in 100 mL IVPB (10 mEq Intravenous New Bag/Given 02/12/23 2101)    Medical Decision Making:    Jenny Chan is a 26 y.o. female who presented to the ED today with chief complaint of overdose of Lamictal.  Detailed above detailed above.     Complete initial physical exam performed, notably the patient  was hemodynamically stable in no acute distress.  Extensive lacerations.      Reviewed and confirmed nursing documentation for past medical history, family history, social history.    Initial Assessment:   Critically ill Lamictal overdose consulted poison control immediately.  They recommended, coingestants, telemetry monitoring for 6 hours close care and management. Patient was observed in the emergency room for 6 hours.  Ultimately able to be cleared required Versed for agitation halfway through this time..  On reassessment she has had  resolution of this agitation is back at her baseline mental status with her mother at bedside.  Endorsing suicidal intent.  Extensive self-inflicted lacerations repaired as above Tdap is up-to-date Cut was with a clean razor per the patient   Final Assessment and Plan:   Patient will need psychiatric consultation for concerning suicidal attempt and is now medically cleared for psychiatric disposition.   Patient placed in psychiatric hold protocol pending their recommendations at this time.   Clinical Impression:  1. Suicide attempt (HCC)   2. Laceration of upper extremity, unspecified laterality, initial encounter      Data Unavailable   Final Clinical Impression(s) / ED Diagnoses Final diagnoses:  Suicide attempt [facility])  Laceration of upper extremity, unspecified laterality, initial encounter    Rx / DC Orders ED Discharge Orders     None         Glyn Ade, MD 02/12/23 2258

## 2023-02-13 ENCOUNTER — Inpatient Hospital Stay (HOSPITAL_COMMUNITY)
Admission: AD | Admit: 2023-02-13 | Discharge: 2023-02-18 | DRG: 885 | Disposition: A | Discharge status: Home / Self Care | Payer: Medicaid Other | Source: Intra-hospital | Attending: Psychiatry | Admitting: Psychiatry

## 2023-02-13 DIAGNOSIS — X789XXA Intentional self-harm by unspecified sharp object, initial encounter: Secondary | ICD-10-CM | POA: Diagnosis present

## 2023-02-13 DIAGNOSIS — F332 Major depressive disorder, recurrent severe without psychotic features: Secondary | ICD-10-CM

## 2023-02-13 DIAGNOSIS — Z833 Family history of diabetes mellitus: Secondary | ICD-10-CM | POA: Diagnosis not present

## 2023-02-13 DIAGNOSIS — Z9151 Personal history of suicidal behavior: Secondary | ICD-10-CM | POA: Diagnosis not present

## 2023-02-13 DIAGNOSIS — F4010 Social phobia, unspecified: Secondary | ICD-10-CM | POA: Diagnosis present

## 2023-02-13 DIAGNOSIS — Z560 Unemployment, unspecified: Secondary | ICD-10-CM

## 2023-02-13 DIAGNOSIS — Z79899 Other long term (current) drug therapy: Secondary | ICD-10-CM

## 2023-02-13 DIAGNOSIS — S61511A Laceration without foreign body of right wrist, initial encounter: Secondary | ICD-10-CM | POA: Diagnosis present

## 2023-02-13 DIAGNOSIS — F339 Major depressive disorder, recurrent, unspecified: Secondary | ICD-10-CM | POA: Diagnosis present

## 2023-02-13 DIAGNOSIS — S61512A Laceration without foreign body of left wrist, initial encounter: Secondary | ICD-10-CM | POA: Diagnosis present

## 2023-02-13 DIAGNOSIS — T1491XA Suicide attempt, initial encounter: Secondary | ICD-10-CM

## 2023-02-13 DIAGNOSIS — F329 Major depressive disorder, single episode, unspecified: Principal | ICD-10-CM | POA: Diagnosis present

## 2023-02-13 LAB — RAPID URINE DRUG SCREEN, HOSP PERFORMED
Amphetamines: NOT DETECTED
Barbiturates: NOT DETECTED
Benzodiazepines: POSITIVE — AB
Cocaine: NOT DETECTED
Opiates: NOT DETECTED
Tetrahydrocannabinol: NOT DETECTED

## 2023-02-13 LAB — URINALYSIS, COMPLETE (UACMP) WITH MICROSCOPIC
Bilirubin Urine: NEGATIVE
Glucose, UA: NEGATIVE mg/dL
Ketones, ur: NEGATIVE mg/dL
Leukocytes,Ua: NEGATIVE
Nitrite: NEGATIVE
Protein, ur: NEGATIVE mg/dL
Specific Gravity, Urine: 1.004 — ABNORMAL LOW (ref 1.005–1.030)
pH: 7 (ref 5.0–8.0)

## 2023-02-13 LAB — ACETAMINOPHEN LEVEL: Acetaminophen (Tylenol), Serum: 13 ug/mL (ref 10–30)

## 2023-02-13 MED ORDER — HYDROXYZINE HCL 25 MG PO TABS
25.0000 mg | ORAL_TABLET | Freq: Three times a day (TID) | ORAL | Status: DC | PRN
  Filled 2023-02-13: qty 1

## 2023-02-13 MED ORDER — ACETAMINOPHEN 325 MG PO TABS: 650.0000 mg | ORAL_TABLET | Freq: Four times a day (QID) | ORAL | Status: DC | PRN

## 2023-02-13 MED ORDER — ONDANSETRON 4 MG PO TBDP
4.0000 mg | ORAL_TABLET | Freq: Once | ORAL | Status: AC
  Administered 2023-02-13: 4 mg via ORAL
  Filled 2023-02-13: qty 1

## 2023-02-13 MED ORDER — TRAZODONE HCL 50 MG PO TABS
50.0000 mg | ORAL_TABLET | Freq: Every evening | ORAL | Status: DC | PRN
  Filled 2023-02-13: qty 1

## 2023-02-13 MED ORDER — SODIUM CHLORIDE 0.9 % IV BOLUS
1000.0000 mL | Freq: Once | INTRAVENOUS | Status: AC
  Administered 2023-02-13: 1000 mL via INTRAVENOUS

## 2023-02-13 MED ORDER — MAGNESIUM HYDROXIDE 400 MG/5ML PO SUSP: 30.0000 mL | Freq: Every day | ORAL | Status: DC | PRN

## 2023-02-13 MED ORDER — HALOPERIDOL LACTATE 5 MG/ML IJ SOLN: 5.0000 mg | Freq: Three times a day (TID) | INTRAMUSCULAR | Status: DC | PRN

## 2023-02-13 MED ORDER — HALOPERIDOL 5 MG PO TABS: 5.0000 mg | ORAL_TABLET | Freq: Three times a day (TID) | ORAL | Status: DC | PRN

## 2023-02-13 MED ORDER — LORAZEPAM 1 MG PO TABS: 2.0000 mg | ORAL_TABLET | Freq: Three times a day (TID) | ORAL | Status: DC | PRN

## 2023-02-13 MED ORDER — DIPHENHYDRAMINE HCL 50 MG/ML IJ SOLN: 50.0000 mg | Freq: Three times a day (TID) | INTRAMUSCULAR | Status: DC | PRN

## 2023-02-13 MED ORDER — DIPHENHYDRAMINE HCL 25 MG PO CAPS: 50.0000 mg | ORAL_CAPSULE | Freq: Three times a day (TID) | ORAL | Status: DC | PRN

## 2023-02-13 MED ORDER — LORAZEPAM 2 MG/ML IJ SOLN: 2.0000 mg | Freq: Three times a day (TID) | INTRAMUSCULAR | Status: DC | PRN

## 2023-02-13 MED ORDER — ALUM & MAG HYDROXIDE-SIMETH 200-200-20 MG/5ML PO SUSP: 30.0000 mL | ORAL | Status: DC | PRN

## 2023-02-13 NOTE — ED Notes (Signed)
Pt BP low while sleeping 80s/50s. Pt a&o. Provider informed, bolus ordered and given

## 2023-02-13 NOTE — Progress Notes (Signed)
Pt was accepted to [facility] TODAY 02/13/2023. Bed assignment:: 306-2  Pt meets inpatient criteria per Alona Bene, PMHNP  Attending Physician will be Phineas Inches, MD  Report can be called to: - Adult unit: [phone]  Pt can arrive after 4 PM  Care Team Notified: [facility] [place], RN, Ilsa Iha, RN, Linwood Dibbles, MD, and Alona Bene, PMHNP  Cathie Beams, [place]  02/13/2023 1:34 PM

## 2023-02-13 NOTE — Progress Notes (Signed)
[facility] ED Progress Note  02/13/2023 4:25 PM Jenny Chan  MRN:  [id number]   Principal Problem: <principal problem not specified> Diagnosis:  Active Problems:   Severe episode of recurrent major depressive disorder, without psychotic features [facility])   ED Assessment Time Calculation: Start Time: 1100 Stop Time: 1130 Total Time in Minutes (Assessment Completion): 30   Subjective:  On evaluation today, the patient is sitting up in bed, watching television in no acute distress. She is calm and cooperative during this assessment. Her appearance is appropriate for environment. Her eye contact is good.  Speech is clear and coherent, normal pace and normal volume.  She reports her mood is "ok".  Affect is congruent with mood.  Thought process coherent and linear.  Thought content logical and within normal limits.  Memory, judgment, and insight poor. She denies auditory and visual hallucinations.  No indication that she is responding to internal stimuli during this assessment.  No delusions elicited during this assessment. She denies homicidal ideations. Appetite and sleep are fair.  She endorses signs of depression, insomnia, anhedonia, changes in her appetite, feelings of worthlessness/guilt,  following a suicide attempt by cutting her wrist and overdosing on prescribed Lamictal. Patient had to have 13 sutures on her right arm. Patient is unsure of the number of pills she took. Patient reports a diagnosis of bipolar and MDD. Patient says she has been feeling suicidal for the past month. Patient states she was triggered today when her 25-year-old daughter, who is diagnosed with autism. Patient unable to contract for safety. Patient is aware that she is being admitted to an inpatient facility and is in agreement.    Past Psychiatric History:  Major depressive disorder, Recurrent, Severe Suicide attempt   [place] Scale:  Flowsheet Row ED from 02/12/2023 in [facility] Emergency Department at [facility] ED from 05/08/2022 in [facility] ED from 02/25/2021 in [facility] at [facility] RISK CATEGORY High Risk No Risk Error: Question 6 not populated       Past Medical History:  Past Medical History:  Diagnosis Date   Seizure-like activity [facility])     Past Surgical History:  Procedure Laterality Date   NO PAST SURGERIES     Family History:  Family History  Problem Relation Age of Onset   Healthy Mother    Healthy Father    Social History:  Social History   Substance and Sexual Activity  Alcohol Use Yes     Social History   Substance and Sexual Activity  Drug Use Never    Social History   Socioeconomic History   Marital status: Single    Spouse name: Not on file   Number of children: 0   Years of education: 12   Highest education level: High school graduate  Occupational History   Occupation: unemployed  Tobacco Use   Smoking status: Never   Smokeless tobacco: Never  Vaping Use   Vaping Use: Never used  Substance and Sexual Activity   Alcohol use: Yes   Drug use: Never   Sexual activity: Yes    Birth control/protection: None  Other Topics Concern   Not on file  Social History Narrative   Right-handed.   Occasional caffeine use.   Lives at home with her mother.   Social Determinants of Health   Financial Resource Strain: Not on file  Food Insecurity: Not on file  Transportation Needs: Not on file  Physical Activity: Not on  file  Stress: Not on file  Social Connections: Not on file    Sleep: Poor  Appetite:  Poor  Current Medications: No current facility-administered medications for this encounter.   Current Outpatient Medications  Medication Sig Dispense Refill   acetaminophen (TYLENOL) 500 MG tablet Take 500 mg by mouth every 6 (six) hours as needed for moderate pain.     ibuprofen (ADVIL) 800 MG tablet Take 1,600 mg by mouth as needed.     lamoTRIgine (LAMICTAL) 100 MG tablet Take  100 mg by mouth 2 (two) times daily.     Multiple Vitamins-Minerals (MULTIVITAMIN WITH MINERALS) tablet Take 2 tablets by mouth daily.      Lab Results:  Results for orders placed or performed during the hospital encounter of 02/12/23 (from the past 48 hour(s))  CBC with Differential     Status: None   Collection Time: 02/12/23  6:09 PM  Result Value Ref Range   WBC 6.8 4.0 - 10.5 K/uL   RBC 4.37 3.87 - 5.11 MIL/uL   Hemoglobin 13.4 12.0 - 15.0 g/dL   HCT 29.5 62.1 - 30.8 %   MCV 90.2 80.0 - 100.0 fL   MCH 30.7 26.0 - 34.0 pg   MCHC 34.0 30.0 - 36.0 g/dL   RDW 65.7 84.6 - 96.2 %   Platelets 325 150 - 400 K/uL   nRBC 0.0 0.0 - 0.2 %   Neutrophils Relative % 50 %   Neutro Abs 3.3 1.7 - 7.7 K/uL   Lymphocytes Relative 40 %   Lymphs Abs 2.7 0.7 - 4.0 K/uL   Monocytes Relative 10 %   Monocytes Absolute 0.7 0.1 - 1.0 K/uL   Eosinophils Relative 0 %   Eosinophils Absolute 0.0 0.0 - 0.5 K/uL   Basophils Relative 0 %   Basophils Absolute 0.0 0.0 - 0.1 K/uL   Immature Granulocytes 0 %   Abs Immature Granulocytes 0.02 0.00 - 0.07 K/uL    Comment: Performed at [facility], [address], [place], [place] [address]  Comprehensive metabolic panel     Status: Abnormal   Collection Time: 02/12/23  6:09 PM  Result Value Ref Range   Sodium 136 135 - 145 mmol/L   Potassium 2.7 (LL) 3.5 - 5.1 mmol/L    Comment: CRITICAL RESULT CALLED TO, READ BACK BY AND VERIFIED WITH H.LACIVITA, RN AT 2018 ON 06.13.24 BY N.THOMPSON    Chloride 104 98 - 111 mmol/L   CO2 18 (L) 22 - 32 mmol/L   Glucose, Bld 116 (H) 70 - 99 mg/dL    Comment: Glucose reference range applies only to samples taken after fasting for at least 8 hours.   BUN 9 6 - 20 mg/dL   Creatinine, Ser 1.32 (H) 0.44 - 1.00 mg/dL   Calcium 8.8 (L) 8.9 - 10.3 mg/dL   Total Protein 7.9 6.5 - 8.1 g/dL   Albumin 4.0 3.5 - 5.0 g/dL   AST 22 15 - 41 U/L   ALT 16 0 - 44 U/L   Alkaline Phosphatase 46 38 - 126 U/L   Total  Bilirubin 0.2 (L) 0.3 - 1.2 mg/dL   GFR, Estimated >44 >01 mL/min    Comment: (NOTE) Calculated using the CKD-EPI Creatinine Equation (2021)    Anion gap 14 5 - 15    Comment: Performed at [facility], [address] [place], [place] [address]  Salicylate level     Status: Abnormal   Collection Time: 02/12/23  6:09  PM  Result Value Ref Range   Salicylate Lvl <7.0 (L) 7.0 - 30.0 mg/dL    Comment: Performed at [facility], [address] [place], [place] [address]  Acetaminophen level     Status: None   Collection Time: 02/12/23  6:09 PM  Result Value Ref Range   Acetaminophen (Tylenol), Serum 20 10 - 30 ug/mL    Comment: (NOTE) Therapeutic concentrations vary significantly. A range of 10-30 ug/mL  may be an effective concentration for many patients. However, some  are best treated at concentrations outside of this range. Acetaminophen concentrations >150 ug/mL at 4 hours after ingestion  and >50 ug/mL at 12 hours after ingestion are often associated with  toxic reactions.  Performed at [facility], [address], [place], [place] [address]   Blood gas, venous     Status: Abnormal   Collection Time: 02/12/23  6:30 PM  Result Value Ref Range   pH, Ven 7.31 7.25 - 7.43   pCO2, Ven 39 (L) 44 - 60 mmHg   pO2, Ven 96 (H) 32 - 45 mmHg   Bicarbonate 19.6 (L) 20.0 - 28.0 mmol/L   Acid-base deficit 6.2 (H) 0.0 - 2.0 mmol/L   O2 Saturation 99.5 %   Patient temperature 37.0     Comment: Performed at [facility], [address], [place], [place] [address]  I-Stat beta hCG blood, ED ([facility], [facility], [facility] only)     Status: None   Collection Time: 02/12/23  6:39 PM  Result Value Ref Range   I-stat hCG, quantitative <5.0 <5 mIU/mL   Comment 3            Comment:   GEST. AGE      CONC.  (mIU/mL)   <=1 WEEK        5 - 50     2 WEEKS       50 - 500     3 WEEKS       100 - 10,000     4 WEEKS     1,000 - 30,000         FEMALE AND NON-PREGNANT FEMALE:     LESS THAN 5 mIU/mL   I-stat chem 8, ED (not at [facility], [facility] or [facility])     Status: Abnormal   Collection Time: 02/12/23  6:43 PM  Result Value Ref Range   Sodium 141 135 - 145 mmol/L   Potassium 3.2 (L) 3.5 - 5.1 mmol/L   Chloride 103 98 - 111 mmol/L   BUN 7 6 - 20 mg/dL   Creatinine, Ser 8.29 0.44 - 1.00 mg/dL   Glucose, Bld 562 (H) 70 - 99 mg/dL    Comment: Glucose reference range applies only to samples taken after fasting for at least 8 hours.   Calcium, Ion 1.13 (L) 1.15 - 1.40 mmol/L   TCO2 21 (L) 22 - 32 mmol/L   Hemoglobin 14.3 12.0 - 15.0 g/dL   HCT 13.0 86.5 - 78.4 %  Acetaminophen level     Status: None   Collection Time: 02/13/23 12:30 AM  Result Value Ref Range   Acetaminophen (Tylenol), Serum 13 10 - 30 ug/mL    Comment: (NOTE) Therapeutic concentrations vary significantly. A range of 10-30 ug/mL  may be an effective concentration for many patients. However, some  are best treated at concentrations outside of this range. Acetaminophen concentrations >150 ug/mL at 4 hours after ingestion  and >50 ug/mL at 12 hours  after ingestion are often associated with  toxic reactions.  Performed at [facility], [address], [place], [place] [address]   Rapid urine drug screen (hospital performed)     Status: Abnormal   Collection Time: 02/13/23  9:24 AM  Result Value Ref Range   Opiates NONE DETECTED NONE DETECTED   Cocaine NONE DETECTED NONE DETECTED   Benzodiazepines POSITIVE (A) NONE DETECTED   Amphetamines NONE DETECTED NONE DETECTED   Tetrahydrocannabinol NONE DETECTED NONE DETECTED   Barbiturates NONE DETECTED NONE DETECTED    Comment: (NOTE) DRUG SCREEN FOR MEDICAL PURPOSES ONLY.  IF CONFIRMATION IS NEEDED FOR ANY PURPOSE, NOTIFY LAB WITHIN 5 DAYS.  LOWEST DETECTABLE LIMITS FOR URINE DRUG SCREEN Drug Class                     Cutoff (ng/mL) Amphetamine and metabolites    1000 Barbiturate and metabolites     200 Benzodiazepine                 200 Opiates and metabolites        300 Cocaine and metabolites        300 THC                            50 Performed at [facility], [address], [place], [place] [address]   Urinalysis, Complete w Microscopic -Urine, Clean Catch     Status: Abnormal   Collection Time: 02/13/23  9:24 AM  Result Value Ref Range   Color, Urine STRAW (A) YELLOW   APPearance CLEAR CLEAR   Specific Gravity, Urine 1.004 (L) 1.005 - 1.030   pH 7.0 5.0 - 8.0   Glucose, UA NEGATIVE NEGATIVE mg/dL   Hgb urine dipstick SMALL (A) NEGATIVE   Bilirubin Urine NEGATIVE NEGATIVE   Ketones, ur NEGATIVE NEGATIVE mg/dL   Protein, ur NEGATIVE NEGATIVE mg/dL   Nitrite NEGATIVE NEGATIVE   Leukocytes,Ua NEGATIVE NEGATIVE   RBC / HPF 0-5 0 - 5 RBC/hpf   WBC, UA 0-5 0 - 5 WBC/hpf   Bacteria, UA RARE (A) NONE SEEN   Squamous Epithelial / HPF 0-5 0 - 5 /HPF   Mucus PRESENT     Comment: Performed at [facility], [address], [place], [place] [address]    Blood Alcohol level:  Lab Results  Component Value Date   ETH <5 10/05/2015    Physical Findings:  CIWA:    COWS:     Musculoskeletal: Strength & Muscle Tone: within normal limits Gait & Station: normal Patient leans: N/A  Psychiatric Specialty Exam:  Presentation  General Appearance:  Appropriate for Environment  Eye Contact: Good  Speech: Clear and Coherent  Speech Volume: Normal  Handedness:No data recorded  Mood and Affect  Mood: Anxious  Affect: Appropriate   Thought Process  Thought Processes: Coherent  Descriptions of Associations:Intact  Orientation:Full (Time, Place and Person)  Thought Content:WDL  History of Schizophrenia/Schizoaffective disorder:No  Duration of Psychotic Symptoms:No data recorded Hallucinations:No data recorded Ideas of Reference:None  Suicidal Thoughts:Suicidal Thoughts: Yes, Passive  Homicidal Thoughts:No data  recorded  Sensorium  Memory: Immediate Good; Recent Good  Judgment: Poor  Insight: Fair   Chartered certified accountant: Fair  Attention Span: Fair  Recall: Fair  Fund of Knowledge: Fair  Language: Good   Psychomotor Activity  Psychomotor Activity: Psychomotor Activity: Normal   Assets  Assets: Communication Skills; Desire  for Improvement; Social Support   Sleep  Sleep: Sleep: Poor    Physical Exam: Physical Exam Vitals and nursing note reviewed. Exam conducted with a chaperone present.  Psychiatric:        Attention and Perception: Attention normal.        Mood and Affect: Mood is depressed. Affect is flat.        Speech: Speech normal.        Behavior: Behavior is cooperative.        Thought Content: Thought content includes suicidal ideation.        Cognition and Memory: Memory normal.        Judgment: Judgment is impulsive and inappropriate.    Review of Systems  Psychiatric/Behavioral:  Positive for depression and suicidal ideas.    Blood pressure 94/71, pulse 100, temperature 98.1 F (36.7 C), temperature source Oral, resp. rate 17, height 5\' 1"  (1.549 m), weight 59.5 kg, SpO2 100 %, unknown if currently breastfeeding. Body mass index is 24.79 kg/m.   Medical Decision Making: Patient admitted to [facility]2 today 02/13/23 after 1600.  Egan Berkheimer MOTLEY-MANGRUM, PMHNP 02/13/2023, 4:25 PM

## 2023-02-13 NOTE — ED Notes (Signed)
[facility] paperwork submitted efile envelope # [id number] awaiting case #

## 2023-02-13 NOTE — BH Assessment (Addendum)
Comprehensive Clinical Assessment (CCA) Note  02/13/2023 Jenny Chan [id number]  Disposition: Clinical report given to Sindy Guadeloupe, NP who recommends inpatient psychiatric admission. RN Huntley Dec and Dr. Zadie Rhine updated.  The patient demonstrates the following risk factors for suicide: Chronic risk factors for suicide include: psychiatric disorder of MDD, bipolar disorder, previous suicide attempts by cutting, and previous self-harm by cutting . Acute risk factors for suicide include: unemployment and social withdrawal/isolation. Protective factors for this patient include: responsibility to others (children, family). Considering these factors, the overall suicide risk at this point appears to be high. Patient is not appropriate for outpatient follow up.  Jenny Chan is a 27 year old single female who presents voluntarily to [facility] ED via EMS, following a suicide attempt by cutting her wrist and overdosing on prescribed Lamictal. Patient had to have 13 sutures on her right arm. Patient is unsure of the number of pills she took. Patient reports a diagnosis of bipolar and MDD. Patient says she has been feeling suicidal for the past month. Patient states she was triggered today when her 32-year-old daughter, who is diagnosed with autism, returned from a 3-day stay with her grandfather. Patient says the noise her daughter makes, and the other symptoms of her diagnosis caused her to become suicidal. Patient acknowledges depressive symptoms to include hopelessness, isolation, guilt, a decrease in energy and decreased appetite. Patient also reports feelings of anxiousness. Patient reports a history of self-harm by cutting and states she last cut in high school. Patient has a previous suicide attempt by cutting her wrist. Patient denies HI, auditory or visual hallucinations. Patient denies substance use.  Patient identifies her primary stressors as her daughter's diagnosis, not being employed and  having no money. Patient states she is home all the time, caring for her daughter and she is used to being able to go out. Patient lives with her mother, two brothers and daughter. Patient identifies her friends as support, stating she does not feel comfortable talking to her family. Per chart review, there is no family history of mental health. Patient denies a history of abuse or trauma.   Patient is not engaged in therapy services. She states she engaged in therapy months ago, which she found to be helpful. Patient reports she stopped due to thinking she was getting better. Patient states her Lamictal is prescribed by [facility], via telehealth. She states she does not believe it is helpful. Patient has two previous inpatient hospitalizations at [facility] 10/06/2015-10/10/2015 and [facility] 05/08/2022 for about five days.   Patient is dressed in scrubs, alert and oriented x4 with soft speech. Patient has good eye contact, and her thought process is coherent. Patient's mood is depressed, and she has a flat affect. There is no indication patient is responding to internal stimuli. Patient was cooperative throughout the assessment.  Chief Complaint:  Chief Complaint  Patient presents with   Suicide Attempt   Visit Diagnosis: Major depressive disorder, Recurrent, Severe Suicide attempt    CCA Screening, Triage and Referral (STR)  Patient Reported Information How did you hear about [place]? Other (Comment) (EMS)  What Is the Reason for Your Visit/Call Today? Jenny Chan is a 27 year old single female who presents voluntarily to [facility] ED via EMS, following a suicide attempt by cutting her wrist and overdosing on prescribed Lamictal. Patient had to have 13 sutures on her right arm. Patient is unsure of the number of pills she took. Patient reports a diagnosis of bipolar and MDD. Patient denies HI, auditory  or visual hallucinations. Patient denies substance use.  How Long Has This Been Causing  You Problems? 1 wk - 1 month  What Do You Feel Would Help You the Most Today? Treatment for Depression or other mood problem   Have You Recently Had Any Thoughts About Hurting Yourself? Yes  Are You Planning to Commit Suicide/Harm Yourself At This time? Yes   Flowsheet Row ED from 02/12/2023 in [facility] Emergency Department at [facility] ED from 05/08/2022 in [facility] ED from 02/25/2021 in [facility] at [facility] RISK CATEGORY High Risk No Risk Error: Question 6 not populated       Have you Recently Had Thoughts About Hurting Someone [name]? No  Are You Planning to Harm Someone at This Time? No  Explanation: N/A   Have You Used Any Alcohol or Drugs in the Past 24 Hours? No  What Did You Use and How Much? N/A   Do You Currently Have a Therapist/Psychiatrist? Yes  Name of Therapist/Psychiatrist: Name of Therapist/Psychiatrist: Psychiatrist through [facility].   Have You Been Recently Discharged From Any Office Practice or Programs? No  Explanation of Discharge From Practice/Program: N/A     CCA Screening Triage Referral Assessment Type of Contact: Tele-Assessment  Telemedicine Service Delivery: Telemedicine service delivery: This service was provided via telemedicine using a 2-way, interactive audio and video technology  Is this Initial or Reassessment? Is this Initial or Reassessment?: Initial Assessment  Date Telepsych consult ordered in [facility]:  Date Telepsych consult ordered in [facility]: 02/12/23  Time Telepsych consult ordered in [facility]:  Time Telepsych consult ordered in [facility]: 2254  Location of Assessment: [facility] ED  Provider Location: [facility] Assessment Services   Collateral Involvement: None   Does Patient Have a Automotive engineer Guardian? No  Legal Guardian Contact Information: N/A  Copy of Legal Guardianship Form: -- (N/A)  Legal Guardian Notified of Arrival: -- (N/A)  Legal Guardian  Notified of Pending Discharge: -- (N/A)  If Minor and Not Living with Parent(s), Who has Custody? N/A  Is CPS involved or ever been involved? Never  Is APS involved or ever been involved? Never   Patient Determined To Be At Risk for Harm To Self or Others Based on Review of Patient Reported Information or Presenting Complaint? Yes, for Self-Harm (Denies HI.)  Method: Plan with intent and identified person (Denies HI.)  Availability of Means: In hand or used (Denies HI.)  Intent: Clearly intends on inflicting harm that could cause death (Denies HI.)  Notification Required: No need or identified person (Denies HI.)  Additional Information for Danger to Others Potential: Previous attempts  Additional Comments for Danger to Others Potential: N/A  Are There Guns or Other Weapons in Your Home? No  Types of Guns/Weapons: N/A  Are These Weapons Safely Secured?                            -- (N/A)  Who Could Verify You Are Able To Have These Secured: N/A  Do You Have any Outstanding Charges, Pending Court Dates, Parole/Probation? Patient denies.  Contacted To Inform of Risk of Harm To Self or Others: -- (N/A)    Does Patient Present under Involuntary Commitment? Yes    [place] of Residence: [place]   Patient Currently Receiving the Following Services: Medication Management   Determination of Need: Emergent (2 hours)   Options For Referral: Inpatient Hospitalization  CCA Biopsychosocial Patient Reported Schizophrenia/Schizoaffective Diagnosis in Past: No   Strengths: Patient connected to psychiatric services.   Mental Health Symptoms Depression:   Increase/decrease in appetite; Hopelessness; Tearfulness; Change in energy/activity   Duration of Depressive symptoms: Duration of Depressive Symptoms: Greater than two weeks   Mania:   None   Anxiety:    Fatigue; Worrying   Psychosis:   None   Duration of Psychotic symptoms:    Trauma:   None    Obsessions:   None   Compulsions:   None   Inattention:   None   Hyperactivity/Impulsivity:   None   Oppositional/Defiant Behaviors:   None   Emotional Irregularity:   None   Other Mood/Personality Symptoms:   N/A    Mental Status Exam Appearance and self-care  Stature:   Small   Weight:   Average weight   Clothing:   -- (Scrubs)   Grooming:   Normal   Cosmetic use:   None   Posture/gait:   Normal   Motor activity:   Not Remarkable   Sensorium  Attention:   Normal   Concentration:   Normal   Orientation:   X5   Recall/memory:   Normal   Affect and Mood  Affect:   Depressed; Flat   Mood:   Depressed   Relating  Eye contact:   Normal   Facial expression:   Depressed; Sad   Attitude toward examiner:   Cooperative   Thought and Language  Speech flow:  Soft   Thought content:   Appropriate to Mood and Circumstances   Preoccupation:   None   Hallucinations:   None   Organization:   Coherent   [employer] of Knowledge:   Average   Intelligence:   Average   Abstraction:   Normal   Judgement:   Fair   Dance movement psychotherapist:   Adequate   Insight:   Fair   Decision Making:   Impulsive   Social Functioning  Social Maturity:   Impulsive   Social Judgement:   Normal   Stress  Stressors:   Surveyor, quantity; Transitions   Coping Ability:   Overwhelmed; Exhausted   Skill Deficits:   None   Supports:   Friends/Service system     Religion: Religion/Spirituality Are You A Religious Person?: No How Might This Affect Treatment?: N/A  Leisure/Recreation: Leisure / Recreation Do You Have Hobbies?: No  Exercise/Diet: Exercise/Diet Do You Exercise?: No Have You Gained or Lost A Significant Amount of Weight in the Past Six Months?: No Do You Follow a Special Diet?: No Do You Have Any Trouble Sleeping?: No   CCA Employment/Education Employment/Work Situation: Employment / Work  Situation Employment Situation: Unemployed Patient's Job has Been Impacted by Current Illness: No Has Patient ever Been in Equities trader?: No  Education: Education Is Patient Currently Attending School?: No Last Grade Completed: 12 Did You Product manager?: No Did You Have An Individualized Education Program (IIEP): No Did You Have Any Difficulty At [employer]?: No Patient's Education Has Been Impacted by Current Illness: No   CCA Family/Childhood History Family and Relationship History: Family history Marital status: Single Does patient have children?: Yes How many children?: 1 How is patient's relationship with their children?: Good  Childhood History:  Childhood History By whom was/is the patient raised?: Mother Did patient suffer any verbal/emotional/physical/sexual abuse as a child?: No Did patient suffer from severe childhood neglect?: No Has patient ever been sexually abused/assaulted/raped as an adolescent or adult?: No  Was the patient ever a victim of a crime or a disaster?: No Witnessed domestic violence?: No Has patient been affected by domestic violence as an adult?: No       CCA Substance Use Alcohol/Drug Use: Alcohol / Drug Use Pain Medications: See MAR Prescriptions: See MAR Over the Counter: See MAR History of alcohol / drug use?: No history of alcohol / drug abuse Longest period of sobriety (when/how long): N/A Negative Consequences of Use:  (N/A) Withdrawal Symptoms:  (N/A)                         ASAM's:  Six Dimensions of Multidimensional Assessment  Dimension 1:  Acute Intoxication and/or Withdrawal Potential:      Dimension 2:  Biomedical Conditions and Complications:      Dimension 3:  Emotional, Behavioral, or Cognitive Conditions and Complications:     Dimension 4:  Readiness to Change:     Dimension 5:  Relapse, Continued use, or Continued Problem Potential:     Dimension 6:  Recovery/Living Environment:     ASAM Severity  Score:    ASAM Recommended Level of Treatment:     Substance use Disorder (SUD)    Recommendations for Services/Supports/Treatments:    Discharge Disposition:    DSM5 Diagnoses: Patient Active Problem List   Diagnosis Date Noted   History of seizures 12/31/2022   SVD (spontaneous vaginal delivery) 10/18/2018   Post term pregnancy at [redacted] weeks gestation 10/16/2018   Observed seizure-like activity (HCC) 07/26/2018   Severe episode of recurrent major depressive disorder, without psychotic features (HCC)      Referrals to Alternative Service(s): Referred to Alternative Service(s):   Place:   Date:   Time:    Referred to Alternative Service(s):   Place:   Date:   Time:    Referred to Alternative Service(s):   Place:   Date:   Time:    Referred to Alternative Service(s):   Place:   Date:   Time:     Cleda Clarks, LCSW

## 2023-02-13 NOTE — ED Notes (Signed)
Attempted to call for report. RN not available at this time.

## 2023-02-13 NOTE — ED Notes (Signed)
patient has been accepted to 306[facility]2 to the services of Dr. Sherron Flemings and may arrive anytime after 4 pm

## 2023-02-13 NOTE — ED Notes (Signed)
Spoke with poison control who recommends repeat Tylenol level.

## 2023-02-13 NOTE — ED Provider Notes (Signed)
Vital signs remained stable.  Patient is to transfer to behavioral health for further treatment   Linwood Dibbles, MD 02/13/23 1340

## 2023-02-13 NOTE — ED Provider Notes (Signed)
Poison center recommended repeat Tylenol level.  This level is trending downward.  Vital signs are improved. She is awake and alert in no acute distress.  She is reporting mild nausea  EKG Interpretation  Date/Time:  Friday February 13 2023 00:56:10 EDT Ventricular Rate:  93 PR Interval:  192 QRS Duration: 89 QT Interval:  347 QTC Calculation: 432 R Axis:   92 Text Interpretation: Sinus rhythm Borderline right axis deviation Confirmed by Zadie Rhine ([id number]) on 02/13/2023 12:57:06 AM        IVC has been completed.  She is now awaiting psychiatric evaluation  The patient has been placed in psychiatric observation due to the need to provide a safe environment for the patient while obtaining psychiatric consultation and evaluation, as well as ongoing medical and medication management to treat the patient's condition.  The patient has been placed under full IVC at this time.    Zadie Rhine, MD 02/13/23 [phone]

## 2023-02-13 NOTE — Progress Notes (Signed)
The patient attended approximately half of the evening A.A.meeting.

## 2023-02-13 NOTE — ED Notes (Signed)
patient has been accepted to [facility] to the services of Dr. Sherron Flemings and may arrive anytime after 4 pm

## 2023-02-14 DIAGNOSIS — F4010 Social phobia, unspecified: Secondary | ICD-10-CM | POA: Insufficient documentation

## 2023-02-14 LAB — LIPID PANEL
Cholesterol: 151 mg/dL (ref 0–200)
HDL: 49 mg/dL (ref >40)
LDL Cholesterol: 93 mg/dL (ref 0–99)
Total CHOL/HDL Ratio: 3.1 RATIO
Triglycerides: 46 mg/dL (ref <150)
VLDL: 9 mg/dL (ref 0–40)

## 2023-02-14 LAB — HEMOGLOBIN A1C
Hgb A1c MFr Bld: 5.2 % (ref 4.8–5.6)
Mean Plasma Glucose: 102.54 mg/dL

## 2023-02-14 LAB — TSH: TSH: 1.398 u[IU]/mL (ref 0.350–4.500)

## 2023-02-14 MED ORDER — HYDROXYZINE HCL 25 MG PO TABS
25.0000 mg | ORAL_TABLET | Freq: Three times a day (TID) | ORAL | Status: DC | PRN
  Administered 2023-02-15: 25 mg via ORAL
  Filled 2023-02-14 (×3): qty 1

## 2023-02-14 MED ORDER — SERTRALINE HCL 50 MG PO TABS
50.0000 mg | ORAL_TABLET | Freq: Once | ORAL | Status: AC
  Administered 2023-02-14: 50 mg via ORAL
  Filled 2023-02-14 (×2): qty 1

## 2023-02-14 MED ORDER — ACETAMINOPHEN 325 MG PO TABS: 650.0000 mg | ORAL_TABLET | Freq: Four times a day (QID) | ORAL | Status: DC | PRN

## 2023-02-14 MED ORDER — POTASSIUM CHLORIDE CRYS ER 20 MEQ PO TBCR
20.0000 meq | EXTENDED_RELEASE_TABLET | Freq: Once | ORAL | Status: AC
  Filled 2023-02-14: qty 1

## 2023-02-14 MED ORDER — SERTRALINE HCL 100 MG PO TABS
100.0000 mg | ORAL_TABLET | Freq: Every day | ORAL | Status: DC
  Administered 2023-02-15 – 2023-02-18 (×4): 100 mg via ORAL
  Filled 2023-02-14 (×7): qty 1

## 2023-02-14 MED ORDER — POTASSIUM CHLORIDE 20 MEQ PO PACK
20.0000 meq | PACK | Freq: Once | ORAL | Status: AC
  Administered 2023-02-14: 20 meq via ORAL
  Filled 2023-02-14 (×2): qty 1

## 2023-02-14 MED ORDER — BISMUTH SUBSALICYLATE 262 MG PO CHEW
524.0000 mg | CHEWABLE_TABLET | ORAL | Status: DC | PRN
  Filled 2023-02-14: qty 2

## 2023-02-14 MED ORDER — POLYETHYLENE GLYCOL 3350 17 G PO PACK: 17.0000 g | PACK | Freq: Every day | ORAL | Status: DC | PRN

## 2023-02-14 MED ORDER — SENNA 8.6 MG PO TABS: 1.0000 | ORAL_TABLET | Freq: Every evening | ORAL | Status: DC | PRN

## 2023-02-14 MED ORDER — ALUM & MAG HYDROXIDE-SIMETH 200-200-20 MG/5ML PO SUSP: 30.0000 mL | ORAL | Status: DC | PRN

## 2023-02-14 MED ORDER — TRAZODONE HCL 50 MG PO TABS
50.0000 mg | ORAL_TABLET | Freq: Every evening | ORAL | Status: DC | PRN
  Filled 2023-02-14 (×3): qty 1

## 2023-02-14 MED ORDER — ONDANSETRON HCL 4 MG PO TABS: 8.0000 mg | ORAL_TABLET | Freq: Three times a day (TID) | ORAL | Status: DC | PRN

## 2023-02-14 NOTE — Group Note (Signed)
Date:  02/14/2023 Time:  8:54 PM  Group Topic/Focus:  Wrap-Up Group:   The focus of this group is to help patients review their daily goal of treatment and discuss progress on daily workbooks.    Participation Level:  Did Not Attend   Scot Dock 02/14/2023, 8:54 PM

## 2023-02-14 NOTE — Progress Notes (Signed)
Pt denied SI/HI/AVH this morning. Pt rated her depression a 5/10, anxiety a 3/10, and feelings of hopelessness a 5/10. Pt complains of 5/10 pain in her arms from her self-inflicted lacerations. RN redressed patient's wounds as requested, no signs or symptoms of infection observed. Pt has been pleasant, calm, and cooperative throughout the shift. RN provided support and encouragement to patient. Pt given scheduled medications as prescribed. Q15 min checks verified for safety. Patient verbally contracts for safety. Patient compliant with medications and treatment plan. Patient is interacting well on the unit. Pt is safe on the unit.   02/14/23 0900  Psych Admission Type (Psych Patients Only)  Admission Status Involuntary  Psychosocial Assessment  Patient Complaints Anxiety;Depression  Eye Contact Brief  Facial Expression Animated  Affect Appropriate to circumstance  Speech Logical/coherent  Interaction Assertive  Motor Activity Other (Comment) (WDL)  Appearance/Hygiene Unremarkable  Behavior Characteristics Appropriate to situation  Mood Pleasant  Thought Process  Coherency WDL  Content WDL  Delusions None reported or observed  Perception WDL  Hallucination None reported or observed  Judgment Impaired  Confusion None  Danger to Self  Current suicidal ideation? Denies  Danger to Others  Danger to Others None reported or observed

## 2023-02-14 NOTE — Progress Notes (Signed)
   02/14/23 0004  Psych Admission Type (Psych Patients Only)  Admission Status Involuntary  Psychosocial Assessment  Patient Complaints Depression  Eye Contact Brief  Facial Expression Animated  Affect Appropriate to circumstance  Speech Logical/coherent  Interaction Assertive  Appearance/Hygiene Unremarkable  Behavior Characteristics Appropriate to situation  Mood Pleasant  Thought Process  Coherency WDL  Content WDL  Delusions None reported or observed  Perception WDL  Hallucination None reported or observed  Judgment Poor  Confusion None  Danger to Self  Current suicidal ideation? Denies  Danger to Others  Danger to Others None reported or observed

## 2023-02-14 NOTE — Group Note (Signed)
Date:  02/14/2023 Time:  5:22 PM  Group Topic/Focus:  Goals Group:   The focus of this group is to help patients establish daily goals to achieve during treatment and discuss how the patient can incorporate goal setting into their daily lives to aide in recovery. Orientation:   The focus of this group is to educate the patient on the purpose and policies of crisis stabilization and provide a format to answer questions about their admission.  The group details unit policies and expectations of patients while admitted.    Participation Level:  Active  Participation Quality:  Appropriate  Affect:  Appropriate  Cognitive:  Appropriate  Insight: Appropriate  Engagement in Group:  Engaged  Modes of Intervention:  Activity, Orientation, and Rapport Building  Additional Comments:   Pt attended and participated in the Orientation/Goals group.  Edmund Hilda Patches Mcdonnell 02/14/2023, 5:22 PM

## 2023-02-14 NOTE — [facility]HH Suicide Risk Assessment (Signed)
[facility] Admission Suicide Risk Assessment  Nursing information obtained from:    Demographic factors:    Current Mental Status:    Loss Factors:    Historical Factors:    Risk Reduction Factors:     Total Time spent with patient: 1.5 hours Principal Problem: MDD (major depressive disorder) Diagnosis:  Principal Problem:   MDD (major depressive disorder)  Subjective Data: patient continues to endorse suicidal ideation  Continued Clinical Symptoms:     CLINICAL FACTORS:   Depression:   Severe  Psychiatric Specialty Exam: Presentation  General Appearance:  Appropriate for Environment; Fairly Groomed   Eye Contact: Good   Speech: Clear and Coherent   Speech Volume: Normal   Handedness:No data recorded   Mood and Affect  Mood: Dysphoric   Affect: Appropriate; Congruent; Full Range   Thought Process  Thought Processes: Coherent; Goal Directed; Linear   Descriptions of Associations:Intact   Orientation:Full (Time, Place and Person)   Thought Content:WDL   Hallucinations:Hallucinations: None   Ideas of Reference:None   Suicidal Thoughts:Suicidal Thoughts: No   Homicidal Thoughts:Homicidal Thoughts: No   Sensorium  Memory: Immediate Good; Recent Good; Remote Good   Judgment: Good   Insight: Good   Executive Functions  Concentration: Good   Attention Span: Good   Recall: Good   Fund of Knowledge: Good   Language: Good   Psychomotor Activity  Psychomotor Activity: Psychomotor Activity: Normal   Assets  Assets: Communication Skills; Desire for Improvement; Social Support; Financial Resources/Insurance   Sleep  Sleep: Sleep: Fair  Sleep  Sleep: Sleep: Fair   Physical Exam: Review of Systems  Constitutional: Negative.   Respiratory: Negative.    Cardiovascular: Negative.   Gastrointestinal: Negative.   Genitourinary: Negative.     Blood pressure 108/65, pulse 87, temperature 98.2 F (36.8 C), temperature source Oral,  resp. rate 18, SpO2 100 %, unknown if currently breastfeeding. There is no height or weight on file to calculate BMI. Physical Exam HENT:     Head: Normocephalic and atraumatic.  Pulmonary:     Effort: Pulmonary effort is normal.  Musculoskeletal:     Cervical back: Normal range of motion.  Skin:    Findings: Rash present.     Comments: Areas of circular hyperpigmented patches on neck, bilateral arms, anterior chest, bilateral sides of inner thighs patient says they are a reaction from metronidazole. Patient was evaluated in the presence of chaperone (medical assistant Penni Bombard)  Neurological:     General: No focal deficit present.     Mental Status: She is alert.   COGNITIVE FEATURES THAT CONTRIBUTE TO RISK:  Thought constriction (tunnel vision)    SUICIDE RISK:  Acute Risk:  Severe:  Frequent, intense, and enduring suicidal ideation, specific plan, no subjective intent, but some objective markers of intent (i.e., choice of lethal method), the method is accessible, some limited preparatory behavior, evidence of impaired self-control, severe dysphoria/symptomatology, multiple risk factors present, and few if any protective factors, particularly a lack of social support.  Chronic Risk:  Severe:  Frequent, intense, and enduring suicidal ideation, specific plan, no subjective intent, but some objective markers of intent (i.e., choice of lethal method), the method is accessible, some limited preparatory behavior, evidence of impaired self-control, severe dysphoria/symptomatology, multiple risk factors present, and few if any protective factors, particularly a lack of social support.  PLAN OF CARE: see H&P for full plan of care  I certify that inpatient services furnished can reasonably be expected to improve the patient's condition.  Signed: Augusto Gamble, MD 02/14/2023, 11:39 AM

## 2023-02-14 NOTE — H&P (Addendum)
Psychiatric Admission Assessment Adult  Patient Identification: Jenny Chan MRN:  [id number] Date of Evaluation:  02/14/2023  Chief Complaint: suicide attempt via overdose Active Problems:   Severe episode of recurrent major depressive disorder, without psychotic features (HCC)   Social anxiety disorder  History of Present Illness:  Jenny Chan is a 27 y.o. woman with a past psychiatric history significant for bipolar disorder and no significant PMHx who presents to the [facility] Involuntary from [facility] Emergency Department for evaluation and management of suicidal ideations with overdose attempt on over 100 lamotrigine tablets and slitting bilateral wrists with razors  Patient says she had a suicide attempt (slit wrists, tried to overdose on over 100 pills of lamotrigine). Patient says her daughter with autism "triggered" her, says daughter runs around the house or keeps asking to play, is being loud around the house. Patient says she is sensitive to loud noises. Also has multiple stressors, still living with mom, having issues at work. Patient felt overwhelmed and wanted to end her life.  Patient endorses prior to admission a 2 week period of depressed mood, loss of interest in pleasurable activity (going to gym, long walks), poor sleep, poor appetite, low energy, poor concentration, and suicidal thoughts.  Patient endorses in the past periods of elevated mood, pressured speech, impulsivity, increased activity, and decreased need for sleep for 1 day.  Patient endorses feeling anxious when she is outside the house or when there is a lot of people around. She says this prevents her from engaging fully with social groups, but does desire more social interaction. She also endorses worrying about daughter and how she might be perceived by others, especially in school with other kids.  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 She says she loves her daughter and wants to provide a good life for her, but at the same time feels overwhelmed in caring for her. Patient's mom helps with childcare, but patient is also seeking more help to take care of her daughter. Patient is interested in outpatient resources (therapist, psychiatrist, etc).  Patient says she was last sexually active in January and was prescribed metronidazole for an STI July or August last year. Patients says she was recently tested for STIs and does not wish to repeat testing at this time.  Chart review: On chart review, prior to this evaluation, patient was transferred from the emergency department after medical clearance.  Subjective Sleep past 24 hours: fair Subjective Appetite past 24 hours: fair  Collateral information obtained (Catina Shoffner, patient's mom) Doran Clay says patient has mood swings. She says patient stays in her room, not talking. She says patient has not had periods of elevated mood in the past. She confirms patient has no access to weapons (no guns).  Catina confirms patient does not abuse alcohol or other drugs.  Mom is concerned patient might attempt suicide again and would like her to get treated for a long time.  Past Psychiatric History:  Previous psych diagnoses:  endorses prior diagnosis of bipolar Prior inpatient psychiatric treatment:  admitted x2 .Once at age 28 for suicide attempt (cut wrists),  once again in 2022 for suicidal thoughts. Current/prior outpatient psychiatric treatment:  [facility] Current psychiatric provider:  patient does not know  Neuromodulation history: denies  Current therapist:  none, patient stopped going Psychotherapy hx:  previously seen at [facility]  History of suicide attempts:  see above History of homicide: Denies  Psychotropic medications: Current Lamotrigine 100 mg daily- patient reportedly taking consistently, reports good response  initially, but then started "losing focus"  Past None  Allergies: endorses the following medication allergies with resulting symptoms: metronidazole (gets hives)  Substance Use History: Alcohol: socially, drinks mixed drinks and wine. Drinks 1 standard drink 2-3 times a month. Hx withdrawal tremors/shakes: denies Hx alcohol related blackouts: denies Hx alcohol induced hallucinations: denies Hx alcoholic seizures: denies Hx delirium tremens (DTs): denies DUI: denies  --------  Tobacco: never smoked, vaped, or chewed tobacco Marijuana: tried in the past Cocaine: denies Methamphetamines: denies Psilocybin: denies MDMA: denies Ecstasy: denies Opiates: denies Benzodiazepines: denies IV drug use: denies Prescribed meds abuse: denies  History of detox: N/A History of rehab: N/A  Is the patient at risk to self? Yes Has the patient been a risk to self in the past 6 months? Yes Has the patient been a risk to self within the distant past? Yes Is the patient a risk to others? No Has the patient been a risk to others in the past 6 months? No Has the patient been a risk to others within the distant past? No  Alcohol Screening:   Tobacco Screening:    Substance Abuse History in the last 12 months: No  Past Medical/Surgical History:  Medical Diagnoses: none Home Rx: none Prior Hosp: denies Prior surgeries / non-head trauma: denies  Head trauma: endorses banging head in shower prior to admission LOC: denies Concussions: denies Seizures: endorses "stress induced" seizure "due to stress," also had seizure during pregnancy when she had argument with mom, and most recently prior to admission after overdose attempt  Last menstrual period and contraceptives:  ended 2 days ago, not on any contraceptives  Family History:  Medical: grandmother on mom's side had diabetes Psych: unknown Psych rx: unknown Suicide: denies Homicide: denies Substance use family hx: denies  Social  History:  Place of birth and grew up where: born in [place], grew up in [place] Abuse: no history of abuse Marital Status: single Sexual orientation: bisexual Children: 9 year old daughter Employment: unemployed, previously worked at [employer] 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 Highest level of education: high school Housing: living with mom Finances: 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) Legal: no Special educational needs teacher: never served Consulting civil engineer: no guns in house Pills stockpile: denies  Lab Results:  Results for orders placed or performed during the hospital encounter of 02/13/23 (from the past 48 hour(s))  Hemoglobin A1c     Status: None   Collection Time: 02/14/23  6:27 AM  Result Value Ref Range   Hgb A1c MFr Bld 5.2 4.8 - 5.6 %    Comment: (NOTE) Pre diabetes:          5.7%-6.4%  Diabetes:              >6.4%  Glycemic control for   <7.0% adults with diabetes    Mean Plasma Glucose 102.54 mg/dL    Comment: Performed at [facility], [address], [place], [place] [address]  TSH     Status: None   Collection Time: 02/14/23  6:27 AM  Result Value Ref Range   TSH 1.398 0.350 - 4.500 uIU/mL    Comment: Performed by a 3rd Generation assay with a functional sensitivity of <=0.01 uIU/mL. Performed at [facility], [address] [place], [place] [address]   Lipid panel     Status: None   Collection Time: 02/14/23  6:27 AM  Result Value Ref Range   Cholesterol 151 0 - 200 mg/dL   Triglycerides 46 <086 mg/dL   HDL 49 >57 mg/dL   Total CHOL/HDL Ratio 3.1 RATIO   VLDL 9 0 - 40 mg/dL   LDL Cholesterol 93 0 - 99 mg/dL    Comment:        Total Cholesterol/HDL:CHD Risk Coronary Heart Disease Risk Table                     Men   Women  1/2 Average Risk   3.4   3.3  Average Risk       5.0   4.4  2 X Average Risk   9.6   7.1  3 X Average Risk  23.4   11.0        Use the calculated Patient Ratio above and the CHD  Risk Table to determine the patient's CHD Risk.        ATP III CLASSIFICATION (LDL):  <100     mg/dL   Optimal  846-962  mg/dL   Near or Above                    Optimal  130-159  mg/dL   Borderline  952-841  mg/dL   High  >324     mg/dL   Very High Performed at [facility], [address] [place], [place] [address]     Blood Alcohol level:  Lab Results  Component Value Date   ETH <5 10/05/2015    Metabolic Disorder Labs:  Lab Results  Component Value Date   HGBA1C 5.2 02/14/2023   MPG 102.54 02/14/2023   MPG 102.54 05/08/2022   No results found for: "PROLACTIN" Lab Results  Component Value Date   CHOL 151 02/14/2023   TRIG 46 02/14/2023   HDL 49 02/14/2023   CHOLHDL 3.1 02/14/2023   VLDL 9 02/14/2023   LDLCALC 93 02/14/2023   LDLCALC 128 (H) 05/08/2022    Current Medications: Current Facility-Administered Medications  Medication Dose Route Frequency Provider Last Rate Last Admin   acetaminophen (TYLENOL) tablet 650 mg  650 mg Oral Q6H PRN Augusto Gamble, MD       And   hydrOXYzine (ATARAX) tablet 25 mg  25 mg Oral TID PRN Augusto Gamble, MD       And   bismuth subsalicylate (PEPTO BISMOL) chewable tablet 524 mg  524 mg Oral Q3H PRN Augusto Gamble, MD       And   senna (SENOKOT) tablet 8.6 mg  1 tablet Oral QHS PRN Augusto Gamble, MD       And   polyethylene glycol (MIRALAX / GLYCOLAX) packet 17 g  17 g Oral Daily PRN Augusto Gamble, MD       And   ondansetron [facility]) tablet 8 mg  8 mg Oral Q8H PRN Augusto Gamble, MD       And   alum & mag hydroxide-simeth (MAALOX/MYLANTA) 200-200-20 MG/5ML suspension 30 mL  30 mL Oral Q4H PRN Augusto Gamble, MD       diphenhydrAMINE (BENADRYL) capsule 50 mg  50 mg Oral TID PRN Motley-Mangrum, Geralynn Ochs A, PMHNP       Or   diphenhydrAMINE (BENADRYL) injection 50 mg  50 mg Intramuscular TID PRN Motley-Mangrum, Jadeka A, PMHNP       haloperidol (HALDOL) tablet 5 mg  5 mg Oral TID PRN Motley-Mangrum, Ezra Sites, PMHNP  Or    haloperidol lactate (HALDOL) injection 5 mg  5 mg Intramuscular TID PRN Motley-Mangrum, Jadeka A, PMHNP       LORazepam (ATIVAN) tablet 2 mg  2 mg Oral TID PRN Motley-Mangrum, Jadeka A, PMHNP       Or   LORazepam (ATIVAN) injection 2 mg  2 mg Intramuscular TID PRN Motley-Mangrum, Jadeka A, PMHNP       sertraline (ZOLOFT) tablet 50 mg  50 mg Oral Once Augusto Gamble, MD       Followed by   Melene Muller ON 02/15/2023] sertraline (ZOLOFT) tablet 100 mg  100 mg Oral Daily Augusto Gamble, MD       traZODone (DESYREL) tablet 50 mg  50 mg Oral QHS PRN Augusto Gamble, MD        PTA Medications: Medications Prior to Admission  Medication Sig Dispense Refill Last Dose   acetaminophen (TYLENOL) 500 MG tablet Take 500 mg by mouth every 6 (six) hours as needed for moderate pain.      ibuprofen (ADVIL) 800 MG tablet Take 1,600 mg by mouth as needed.      lamoTRIgine (LAMICTAL) 100 MG tablet Take 100 mg by mouth 2 (two) times daily.      Multiple Vitamins-Minerals (MULTIVITAMIN WITH MINERALS) tablet Take 2 tablets by mouth daily.       Physical Findings: AIMS: No  CIWA:    COWS:     Psychiatric Specialty Exam: Presentation  General Appearance:  Appropriate for Environment; Fairly Groomed  Eye Contact: Good  Speech: Clear and Coherent  Speech Volume: Normal  Handedness:No data recorded  Mood and Affect  Mood: Dysphoric  Affect: Appropriate; Congruent; Full Range  Thought Process  Thought Processes: Coherent; Goal Directed; Linear  Descriptions of Associations:Intact  Orientation:Full (Time, Place and Person)  Thought Content:WDL  Hallucinations:Hallucinations: None  Ideas of Reference:None  Suicidal Thoughts:Suicidal Thoughts: No  Homicidal Thoughts:Homicidal Thoughts: No  Sensorium  Memory: Immediate Good; Recent Good; Remote Good  Judgment: Good  Insight: Good  Executive Functions  Concentration: Good  Attention Span: Good  Recall: Good  Fund of  Knowledge: Good  Language: Good  Psychomotor Activity  Psychomotor Activity: Psychomotor Activity: Normal  Assets  Assets: Communication Skills; Desire for Improvement; Social Support; Financial Resources/Insurance  Sleep  Sleep: Sleep: Fair  No data recorded  Review of Systems  Constitutional: Negative.   Respiratory: Negative.    Cardiovascular: Negative.   Gastrointestinal: Negative.   Genitourinary: Negative.    Blood pressure 108/65, pulse 87, temperature 98.2 F (36.8 C), temperature source Oral, resp. rate 18, SpO2 100 %, unknown if currently breastfeeding. There is no height or weight on file to calculate BMI. Physical Exam HENT:     Head: Normocephalic and atraumatic.  Pulmonary:     Effort: Pulmonary effort is normal.  Musculoskeletal:     Cervical back: Normal range of motion.  Skin:    Findings: Rash present.     Comments: Areas of circular hyperpigmented patches on neck, bilateral arms, anterior chest, bilateral sides of inner thighs patient says they are a reaction from metronidazole. Patient was evaluated in the presence of chaperone (medical assistant Penni Bombard)  Neurological:     General: No focal deficit present.     Mental Status: She is alert.    Assets  Assets:Communication Skills; Desire for Improvement; Social Support; Financial Resources/Insurance   Treatment Plan Summary: Daily contact with patient to assess and evaluate symptoms and progress in treatment and medication management  ASSESSMENT: Patient currently and  in the past for at least a 2 week period endorses depressed mood, anhedonia, decreased appetite, insomnia, psychomotor retardation, feelings of worthlessness or excessive / inappropriate guilt, fatigue / loss of energy, diminished ability to think / concentrate / indecisiveness, and recurrent thoughts of death. Negative for all other MDD criteria. This was not accompanied by hallucinations or delusions. She screens negatively for  manic / hypomanic episodes. Patient meets criteria for the diagnosis of major depressive disorder (MDD) recurrent episode, severe    rule out bipolar disorder  The patient has a marked fear or anxiety about one or more social situations in which she is exposed to possible scrutiny by others, specifically when she is around a lot of people. She fears that she will act in a way or show anxiety symptoms that will be negatively evaluated. These social situations almost always provoke fear or anxiety, and are avoided or endured with intense fear or anxiety. The fear, anxiety, or avoidance is out of proportion to the actual threat posed by the social situation and to the sociocultural context, causes clinically significant distress or impairment in important areas of functioning, and is persistent, typically lasting ? 6 months. The fear, anxiety, or avoidance is not better explained by substances / medications or another mental disorder. If another medical condition is present, the fear, anxiety, or avoidance is clearly unrelated or excessive. Patient meets criteria for social anxiety disorder.  Patient does not meet criteria for bipolar disorder, though was previously diagnosed with this and prescribed lamotrigine which she overdosed on leading to this admission.  these diagnoses are provisional diagnoses and subject to change as the patient's clinical picture evolves or new information is revealed, including substances (drugs of abuse, medications), another medical condition, or better explained by another psychiatric diagnosis.  PLAN: Safety and Monitoring:  -- Involuntary admission to inpatient psychiatric unit for safety, stabilization and treatment  -- Daily contact with patient to assess and evaluate symptoms and progress in treatment  -- Patient's case to be discussed in multi-disciplinary team meeting  -- Observation Level : q15 minute checks  -- Vital signs: q12 hours  -- Precautions: suicide,  elopement, and assault  2. Interventions (medications, psychoeducation, etc):   -- Start sertraline 50 mg once today, then 100 mg daily tomorrow to treat depressive symptoms and social anxiety disorder  -- Patient does not need nicotine replacement  PRN medications for symptomatic management:              -- start acetaminophen 650 mg every 6 hours as needed for mild to moderate pain, fever, and headaches              -- start hydroxyzine 25 mg three times a day as needed for anxiety              -- start bismuth subsalicylate 524 mg oral chewable tablet every 3 hours as needed for diarrhea / loose stools              -- start senna 8.6 mg oral at bedtime and polyethylene glycol 17 g oral daily as needed for mild to moderate constipation              -- start ondansetron 8 mg every 8 hours as needed for nausea or vomiting              -- start aluminum-magnesium hydroxide + simethicone 30 mL every 4 hours as needed for heartburn or indigestion  -- start trazodone 50 mg  at bedtime as needed for insomnia  The risks/benefits/side-effects/alternatives to the above medication were discussed in detail with the patient and time was given for questions. The patient consents to medication trial. FDA black box warnings, if present, were discussed.  The patient is agreeable with the medication plan, as above. We will monitor the patient's response to pharmacologic treatment, and adjust medications as necessary.  3. Routine and other pertinent labs: EKG monitoring: QTc: 432 (02/13/2023)  Metabolism / endocrine: BMI: There is no height or weight on file to calculate BMI. Prolactin: No results found for: "PROLACTIN" Lipid Panel: Lab Results  Component Value Date   CHOL 151 02/14/2023   TRIG 46 02/14/2023   HDL 49 02/14/2023   CHOLHDL 3.1 02/14/2023   VLDL 9 02/14/2023   LDLCALC 93 02/14/2023   LDLCALC 128 (H) 05/08/2022   HbgA1c: Hgb A1c MFr Bld (%)  Date Value  02/14/2023 5.2   TSH: TSH  (uIU/mL)  Date Value  02/14/2023 1.398   Urine pregnancy test obtained, results pending  Drugs of Abuse     Component Value Date/Time   LABOPIA NONE DETECTED 02/13/2023 0924   COCAINSCRNUR NONE DETECTED 02/13/2023 0924   LABBENZ POSITIVE (A) 02/13/2023 0924   AMPHETMU NONE DETECTED 02/13/2023 0924   THCU NONE DETECTED 02/13/2023 0924   LABBARB NONE DETECTED 02/13/2023 0924     4. Group Therapy:  -- Encouraged patient to participate in unit milieu and in scheduled group therapies   -- Short Term Goals: Ability to identify changes in lifestyle to reduce recurrence of condition, verbalize feelings, identify and develop effective coping behaviors, maintain clinical measurements within normal limits, and identify triggers associated with substance abuse/mental health issues will improve. Improvement in ability to disclose and discuss suicidal ideas, demonstrate self-control, and comply with prescribed medications.  -- Long Term Goals: Improvement in symptoms so as ready for discharge -- Patient is encouraged to participate in group therapy while admitted to the psychiatric unit. -- We will address other chronic and acute stressors, which contributed to the patient's MDD (major depressive disorder) in order to reduce the risk of self-harm at discharge.  5. Discharge Planning:   -- Social work and case management to assist with discharge planning and identification of hospital follow-up needs prior to discharge  -- Estimated LOS: 5-7 days  -- Discharge Concerns: Need to establish a safety plan; Medication compliance and effectiveness  -- Discharge Goals: Return home with outpatient referrals for mental health follow-up including medication management/psychotherapy  I certify that inpatient services furnished can reasonably be expected to improve the patient's condition.    I discussed my assessment, planned testing and intervention for the patient with Dr. Abbott Pao who agrees with my  formulated course of action.  Signed: Augusto Gamble, MD 02/14/2023, 3:08 PM

## 2023-02-14 NOTE — Progress Notes (Signed)
   02/14/23 2242  Psych Admission Type (Psych Patients Only)  Admission Status Involuntary  Psychosocial Assessment  Patient Complaints Anxiety  Eye Contact Brief  Facial Expression Animated  Affect Appropriate to circumstance  Speech Logical/coherent  Interaction Assertive  Motor Activity Other (Comment) (WDL)  Appearance/Hygiene Unremarkable  Behavior Characteristics Appropriate to situation  Mood Pleasant  Thought Process  Coherency WDL  Content WDL  Delusions None reported or observed  Perception WDL  Hallucination None reported or observed  Judgment Impaired  Confusion None  Danger to Self  Current suicidal ideation? Denies  Danger to Others  Danger to Others None reported or observed

## 2023-02-14 NOTE — Progress Notes (Signed)
Urine sample collected and placed in lab refrigerator.

## 2023-02-14 NOTE — Progress Notes (Signed)
   02/14/23 2240  Psych Admission Type (Psych Patients Only)  Admission Status Involuntary  Psychosocial Assessment  Patient Complaints Anxiety  Eye Contact Brief  Facial Expression Animated  Affect Appropriate to circumstance  Speech Logical/coherent  Interaction Assertive  Motor Activity Other (Comment) (WDL)  Appearance/Hygiene Unremarkable  Behavior Characteristics Appropriate to situation  Mood Pleasant  Thought Process  Coherency WDL  Content WDL  Delusions None reported or observed  Perception WDL  Hallucination None reported or observed  Judgment Impaired  Confusion None  Danger to Self  Current suicidal ideation? Denies  Danger to Others  Danger to Others None reported or observed

## 2023-02-15 ENCOUNTER — Encounter (HOSPITAL_COMMUNITY): Payer: Self-pay | Admitting: Psychiatry

## 2023-02-15 DIAGNOSIS — F332 Major depressive disorder, recurrent severe without psychotic features: Principal | ICD-10-CM

## 2023-02-15 LAB — BASIC METABOLIC PANEL
Anion gap: 8 (ref 5–15)
BUN: 9 mg/dL (ref 6–20)
CO2: 24 mmol/L (ref 22–32)
Calcium: 8.9 mg/dL (ref 8.9–10.3)
Chloride: 105 mmol/L (ref 98–111)
Creatinine, Ser: 0.76 mg/dL (ref 0.44–1.00)
GFR, Estimated: >60 mL/min (ref >60)
Glucose, Bld: 96 mg/dL (ref 70–99)
Potassium: 3.8 mmol/L (ref 3.5–5.1)
Sodium: 137 mmol/L (ref 135–145)

## 2023-02-15 LAB — PREGNANCY, URINE: Preg Test, Ur: NEGATIVE

## 2023-02-15 NOTE — [facility] Group Notes (Signed)
[facility] Group Notes:  (Nursing/MHT/Case Management/Adjunct)  Date:  02/15/2023  Time:0900  Type of Therapy:  Psychoeducational Skills/Daily Goals Group  Participation Level:  Did Not Attend  Participation Quality:  na  Affect:  na  Cognitive:  na  Insight:  None  Engagement in Group:  na  Modes of Intervention:  na  Summary of Progress/Problems: Pt did not attend.   Jenny Chan 02/15/2023, 11:00 AM 

## 2023-02-15 NOTE — BHH Counselor (Addendum)
Adult Comprehensive Assessment  Patient ID: Jenny Chan, female   DOB: 1996-04-01, 27 y.o.   MRN: [id number]  Information Source:    Current Stressors:  Patient states their primary concerns and needs for treatment are:: "I am not sure" to get stable Patient states their goals for this hospitilization and ongoing recovery are:: Per patient, finding ways to cope with depression and feeling down Educational / Learning stressors: none reported Employment / Job issues: none reported Family Relationships: Patient resides with her mother and 2 brothers. Relationship with sister and brother strained due to recent hospitalization, patient also has a daughter who has been diagnosed with autism, often becomes overwhelmed with her care Financial / Lack of resources (include bankruptcy): none reported Housing / Lack of housing: none reported Physical health (include injuries & life threatening diseases): none Social relationships: none reported Substance abuse: none reported Bereavement / Loss: none reported  Living/Environment/Situation:  Living Arrangements: Parent Living conditions (as described by patient or guardian): Patient states that her mother is supportive in general, however not very supportive emotionally Who else lives in the home?: Patient has 2 younger brothers that live in the home, in addition to her daughter How long has patient lived in current situation?: "all of my life" What is atmosphere in current home: Comfortable  Family History:  Marital status: Single Are you sexually active?: No Does patient have children?: Yes How many children?: 1 How is patient's relationship with their children?: Good  Childhood History:  By whom was/is the patient raised?: Mother Description of patient's relationship with caregiver when they were a child: Supportive, per patient her mother worked Theme park manager to take care of [place] Patient's description of current relationship with people who raised  him/her: Patient states that her relationship with her mother was good, however she feels that she lacked emotional support How were you disciplined when you got in trouble as a child/adolescent?: spankings Does patient have siblings?: Yes Number of Siblings: 3 Description of patient's current relationship with siblings: Since hospitalization, relationship wirth brother and sister have been strained Did patient suffer any verbal/emotional/physical/sexual abuse as a child?: No Did patient suffer from severe childhood neglect?: No Has patient ever been sexually abused/assaulted/raped as an adolescent or adult?: No Was the patient ever a victim of a crime or a disaster?: No Witnessed domestic violence?: No Has patient been affected by domestic violence as an adult?: No  Education:  Highest grade of school patient has completed: HS diploam Currently a student?: No Learning disability?: No  Employment/Work Situation:   Employment Situation: Unemployed Patient's Job has Been Impacted by Current Illness: No What is the Longest Time Patient has Held a Job?: 2 years Where was the Patient Employed at that Time?: [employer] Has Patient ever Been in the [employer]?: No  Financial Resources:   Surveyor, quantity resources: Sales executive, No income Does patient have a Lawyer or guardian?: No  Alcohol/Substance Abuse:   What has been your use of drugs/alcohol within the last 12 months?: none reported Alcohol/Substance Abuse Treatment Hx: Denies past history  Social Support System:   Forensic psychologist System: Fair Type of faith/religion: spiritual How does patient's faith help to cope with current illness?: "I use meditation"  Leisure/Recreation:   Do You Have Hobbies?: Yes Leisure and Hobbies: enjoys excercise and photography  Strengths/Needs:   What is the patient's perception of their strengths?: Per patient she is supportive, good listener. and creative Patient  states they can use these personal strengths during their treatment  to contribute to their recovery: Per patient, she can use her creativity as a strength to contribute her recovery Patient states these barriers may affect/interfere with their treatment: none reported Patient states these barriers may affect their return to the community: none reported  Discharge Plan:   Currently receiving community mental health services: No Does patient have access to transportation?: Yes Does patient have financial barriers related to discharge medications?: No Will patient be returning to same living situation after discharge?: Yes  Summary/Recommendations:   Summary and Recommendations (to be completed by the evaluator): Jenny Chan is a 27 y.o. woman with a past psychiatric history significant for bipolar disorder and who presents to the [facility] Involuntary from [facility] Emergency Department for evaluation and management of suicidal ideations with overdose attempt on over 100 lamotrigine tablets and slitting bilateral wrists with razors. Patient states that she was overwhelmed and is interested in finding ways  to cope with depression and overall life stressors.Patient would benefit from group therapy, medication management, psychoeducation, crisis stabilization, peer support and discharge planning.  At discharge it is recommended that the patient adhere to the established aftercare plan.  Bich Mchaney, [place]. 02/15/2023

## 2023-02-15 NOTE — Progress Notes (Signed)
[facility] MD Progress Note  02/15/2023 9:54 AM Jenny Chan  MRN:  [id number]   Reason for Admission:  Jenny Chan is a 27 y.o. woman with a past psychiatric history significant for bipolar disorder and no significant PMHx who presents to the [facility] Involuntary from [facility] Emergency Department for evaluation and management of suicidal ideations with overdose attempt on over 100 lamotrigine tablets and slitting bilateral wrists with razors. The patient is currently on Hospital Day 2.   Chart Review from last 24 hours:  The patient's chart was reviewed and nursing notes were reviewed. The patient's case was discussed in multidisciplinary team meeting. Per [facility] patient is compliant with medications on the unit, no as needed medication needed or used for anxiety agitation or sleep.  Information Obtained Today During Patient Interview: The patient was seen and evaluated on the unit. On assessment today the patient reports more interrupted sleep last night, she was advised to use trazodone as needed and agreed she will tonight.  She also reports on and off anxiety during daytime she was advised to use Vistaril as needed and she agreed.  She continues to identify suicide intention of her attempted prior to admission of overdosing medication as well as cutting her arms notes hopeful to get some support from family and some resources to help her with autistic child to be able to work more efficiently given she reports worsening is secondary to stress taking care of her child.  She denies any passive or active SI intention or plan and identifies her suicide attempt as "stupid" she is able to identify goals of living "I have my daughter I want to live to see her grow" she denies HI or AVH she denies side effect to current medications started.  She denies any symptoms consistent with mania or hypomania, will continue to follow.   Sleep  Sleep:Sleep: Fair   Principal Problem: MDD (major  depressive disorder) Diagnosis: Active Problems:   Severe episode of recurrent major depressive disorder, without psychotic features (HCC)   Social anxiety disorder    Past Psychiatric History:  Previous psych diagnoses:  endorses prior diagnosis of bipolar Prior inpatient psychiatric treatment:  admitted x2 .Once at age 30 for suicide attempt (cut wrists),  once again in 2022 for suicidal thoughts. Current/prior outpatient psychiatric treatment:  [facility] Current psychiatric provider:  patient does not know   Neuromodulation history: denies   Current therapist:  none, patient stopped going Psychotherapy hx:  previously seen at [facility]   History of suicide attempts:  see above History of homicide: Denies  Past Medical History:  Past Medical History:  Diagnosis Date   Seizure-like activity (HCC)     Past Surgical History:  Procedure Laterality Date   NO PAST SURGERIES     Family History:  Family History  Problem Relation Age of Onset   Healthy Mother    Healthy Father    Family Psychiatric  History:  Medical: grandmother on mom's side had diabetes Psych: unknown Psych rx: unknown Suicide: denies Homicide: denies Substance use family hx: denies Social History:  Place of birth and grew up where: born in [place], grew up in [place] Abuse: no history of abuse Marital Status: single Sexual orientation: bisexual Children: 24 year old daughter Employment: unemployed, previously worked at [employer] 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 Highest level of education: high school Housing: living with mom Finances: 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) Legal: no Special educational needs teacher:  never served Consulting civil engineer: no guns in house Pills stockpile: denies  Current Medications: Current Facility-Administered Medications  Medication Dose Route Frequency Provider Last Rate Last Admin   acetaminophen (TYLENOL) tablet 650 mg  650 mg Oral  Q6H PRN Augusto Gamble, MD       And   hydrOXYzine (ATARAX) tablet 25 mg  25 mg Oral TID PRN Augusto Gamble, MD       And   bismuth subsalicylate (PEPTO BISMOL) chewable tablet 524 mg  524 mg Oral Q3H PRN Augusto Gamble, MD       And   senna (SENOKOT) tablet 8.6 mg  1 tablet Oral QHS PRN Augusto Gamble, MD       And   polyethylene glycol (MIRALAX / GLYCOLAX) packet 17 g  17 g Oral Daily PRN Augusto Gamble, MD       And   ondansetron [facility]) tablet 8 mg  8 mg Oral Q8H PRN Augusto Gamble, MD       And   alum & mag hydroxide-simeth (MAALOX/MYLANTA) 200-200-20 MG/5ML suspension 30 mL  30 mL Oral Q4H PRN Augusto Gamble, MD       diphenhydrAMINE (BENADRYL) capsule 50 mg  50 mg Oral TID PRN Motley-Mangrum, Geralynn Ochs A, PMHNP       Or   diphenhydrAMINE (BENADRYL) injection 50 mg  50 mg Intramuscular TID PRN Motley-Mangrum, Jadeka A, PMHNP       haloperidol (HALDOL) tablet 5 mg  5 mg Oral TID PRN Motley-Mangrum, Jadeka A, PMHNP       Or   haloperidol lactate (HALDOL) injection 5 mg  5 mg Intramuscular TID PRN Motley-Mangrum, Jadeka A, PMHNP       LORazepam (ATIVAN) tablet 2 mg  2 mg Oral TID PRN Motley-Mangrum, Jadeka A, PMHNP       Or   LORazepam (ATIVAN) injection 2 mg  2 mg Intramuscular TID PRN Motley-Mangrum, Jadeka A, PMHNP       sertraline (ZOLOFT) tablet 100 mg  100 mg Oral Daily Augusto Gamble, MD   100 mg at 02/15/23 0801   traZODone (DESYREL) tablet 50 mg  50 mg Oral QHS PRN Augusto Gamble, MD        Lab Results:  Results for orders placed or performed during the hospital encounter of 02/13/23 (from the past 48 hour(s))  Hemoglobin A1c     Status: None   Collection Time: 02/14/23  6:27 AM  Result Value Ref Range   Hgb A1c MFr Bld 5.2 4.8 - 5.6 %    Comment: (NOTE) Pre diabetes:          5.7%-6.4%  Diabetes:              >6.4%  Glycemic control for   <7.0% adults with diabetes    Mean Plasma Glucose 102.54 mg/dL    Comment: Performed at [facility], [address] [place], [place] [address]   TSH     Status: None   Collection Time: 02/14/23  6:27 AM  Result Value Ref Range   TSH 1.398 0.350 - 4.500 uIU/mL    Comment: Performed by a 3rd Generation assay with a functional sensitivity of <=0.01 uIU/mL. Performed at [facility], [address], [place], [place] [address]   Lipid panel     Status: None   Collection Time: 02/14/23  6:27 AM  Result Value Ref Range   Cholesterol 151 0 - 200 mg/dL   Triglycerides 46 <761 mg/dL   HDL 49 >60  mg/dL   Total CHOL/HDL Ratio 3.1 RATIO   VLDL 9 0 - 40 mg/dL   LDL Cholesterol 93 0 - 99 mg/dL    Comment:        Total Cholesterol/HDL:CHD Risk Coronary Heart Disease Risk Table                     Men   Women  1/2 Average Risk   3.4   3.3  Average Risk       5.0   4.4  2 X Average Risk   9.6   7.1  3 X Average Risk  23.4   11.0        Use the calculated Patient Ratio above and the CHD Risk Table to determine the patient's CHD Risk.        ATP III CLASSIFICATION (LDL):  <100     mg/dL   Optimal  161-096  mg/dL   Near or Above                    Optimal  130-159  mg/dL   Borderline  045-409  mg/dL   High  >811     mg/dL   Very High Performed at [facility], [address] [place], [place] [address]   Pregnancy, urine     Status: None   Collection Time: 02/14/23  4:07 PM  Result Value Ref Range   Preg Test, Ur NEGATIVE NEGATIVE    Comment:        THE SENSITIVITY OF THIS METHODOLOGY IS >25 mIU/mL. Performed at [facility], [address], [place], [place] [address]   Basic metabolic panel     Status: None   Collection Time: 02/15/23  6:40 AM  Result Value Ref Range   Sodium 137 135 - 145 mmol/L   Potassium 3.8 3.5 - 5.1 mmol/L   Chloride 105 98 - 111 mmol/L   CO2 24 22 - 32 mmol/L   Glucose, Bld 96 70 - 99 mg/dL    Comment: Glucose reference range applies only to samples taken after fasting for at least 8 hours.   BUN 9 6 - 20 mg/dL   Creatinine, Ser 1.30 0.44 -  1.00 mg/dL   Calcium 8.9 8.9 - 86.5 mg/dL   GFR, Estimated >78 >46 mL/min    Comment: (NOTE) Calculated using the CKD-EPI Creatinine Equation (2021)    Anion gap 8 5 - 15    Comment: Performed at [facility], [address], [place], [place] [address]    Blood Alcohol level:  Lab Results  Component Value Date   ETH <5 10/05/2015    Metabolic Disorder Labs: Lab Results  Component Value Date   HGBA1C 5.2 02/14/2023   MPG 102.54 02/14/2023   MPG 102.54 05/08/2022   No results found for: "PROLACTIN" Lab Results  Component Value Date   CHOL 151 02/14/2023   TRIG 46 02/14/2023   HDL 49 02/14/2023   CHOLHDL 3.1 02/14/2023   VLDL 9 02/14/2023   LDLCALC 93 02/14/2023   LDLCALC 128 (H) 05/08/2022    Physical Findings: AIMS:  , ,  ,  ,    CIWA:    COWS:     Musculoskeletal: Strength & Muscle Tone: within normal limits Gait & Station: normal Patient leans: N/A  Psychiatric Specialty Exam:  General Appearance: Dressed casually, fairly dressed and groomed, average hygiene  Behavior: Cooperative  Psychomotor Activity: Mild psychomotor retardation but  no agitation noted  Eye Contact: Fair Speech: Normal amount, very decreased tone and volume questionable baseline   Mood: Moderately dysphoric Affect: Restricted sad affect  Thought Process: Linear and goal-directed Descriptions of Associations: Intact, concrete Thought Content: Hallucinations: Denies AH, VH  Delusions: No paranoia  Suicidal Thoughts: Denies passive or active SI, intention, plan  Homicidal Thoughts: Denies HI, intention, plan   Alertness/Orientation: Alert and oriented  Insight: Improved yet limited Judgment: Improved yet limited  Memory: Fair  Art therapist  Concentration: Fair Attention Span: Fair Recall: [employer] of Knowledge: Fair   Assets  Assets: Manufacturing systems engineer; Desire for Improvement; Social Support; Financial Resources/Insurance    Physical  Exam: Physical Exam ROS Blood pressure 109/68, pulse 89, temperature 97.7 F (36.5 C), temperature source Oral, resp. rate 18, SpO2 97 %, unknown if currently breastfeeding. There is no height or weight on file to calculate BMI.   Treatment Plan Summary: Daily contact with patient to assess and evaluate symptoms and progress in treatment and Medication management  ASSESSMENT:  Diagnoses / Active Problems: Principal Problem: MDD (major depressive disorder) Diagnosis: Active Problems:   Severe episode of recurrent major depressive disorder, without psychotic features (HCC)   Social anxiety disorder   PLAN: Safety and Monitoring:  -- Involuntary admission to inpatient psychiatric unit for safety, stabilization and treatment  -- Daily contact with patient to assess and evaluate symptoms and progress in treatment  -- Patient's case to be discussed in multi-disciplinary team meeting  -- Observation Level : q15 minute checks  -- Vital signs:  q12 hours  -- Precautions: suicide, elopement, and assault  2. Medications:   Continue trial of Zoloft 100 mg daily to address depression and anxiety symptoms, monitor if any symptoms of mania or hypomania, will follow.  Continue Vistaril 25 mg 3 times daily as needed for anxiety  Continue trazodone 50 mg at bedtime as needed for sleep  Continue as needed medication for agitation pain constipation and nausea  The risks/benefits/side-effects/alternatives to this medication were discussed in detail with the patient and time was given for questions. The patient consents to medication trial.      3. Pertinent labs: CMP within normal level, UA no UTI noted, pregnancy test negative, CBC no significant abnormalities noted, TSH within normal level      Lab ordered: None  4. Group and Therapy: -- Encouraged patient to participate in unit milieu and in scheduled group therapies       -- Short Term Goals: Ability to identify changes in lifestyle to  reduce recurrence of condition will improve, Ability to verbalize feelings will improve, Ability to disclose and discuss suicidal ideas, and Ability to demonstrate self-control will improve  -- Long Term Goals: Improvement in symptoms so as ready for discharge  5. Discharge Planning:   -- Social work and case management to assist with discharge planning and identification of hospital follow-up needs prior to discharge  -- Estimated LOS: 5-7 days  -- Discharge Concerns: Need to establish a safety plan; Medication compliance and effectiveness  -- Discharge Goals: Return home with outpatient referrals for mental health follow-up including medication management/psychotherapy      Total Time Spent in Direct Patient Care:  I personally spent 35 minutes on the unit in direct patient care. The direct patient care time included face-to-face time with the patient, reviewing the patient's chart, communicating with other professionals, and coordinating care. Greater than 50% of this time was spent in counseling or coordinating care with the patient regarding  goals of hospitalization, psycho-education, and discharge planning needs.   Araceli Arango Abbott Pao, MD 02/15/2023, 9:54 AM

## 2023-02-15 NOTE — Progress Notes (Signed)
   02/15/23 2305  Psych Admission Type (Psych Patients Only)  Admission Status Involuntary  Psychosocial Assessment  Patient Complaints None  Eye Contact Brief  Facial Expression Animated  Affect Appropriate to circumstance  Speech Logical/coherent  Interaction Minimal  Motor Activity Other (Comment) (WDL)  Appearance/Hygiene Unremarkable  Behavior Characteristics Appropriate to situation  Mood Pleasant  Thought Process  Coherency WDL  Content WDL  Delusions None reported or observed  Perception WDL  Hallucination None reported or observed  Judgment Impaired  Confusion None  Danger to Self  Current suicidal ideation? Denies  Danger to Others  Danger to Others None reported or observed

## 2023-02-15 NOTE — BHH Group Notes (Signed)
Adult Psychoeducational Group Note  Date:  02/15/2023 Time:  10:00 PM  Group Topic/Focus:  Wrap-Up Group:   The focus of this group is to help patients review their daily goal of treatment and discuss progress on daily workbooks.  Participation Level:  Active  Participation Quality:  Appropriate  Affect:  Appropriate  Cognitive:  Appropriate  Insight: Appropriate  Engagement in Group:  Engaged  Modes of Intervention:  Discussion and Education  Additional Comments:  Pt attended and participated in group. Pt shared she had the opportunity to see her brother during visitation today.  Maura Crandall Cassandra 02/15/2023, 10:00 PM

## 2023-02-15 NOTE — Progress Notes (Signed)
   02/15/23 1000  Psych Admission Type (Psych Patients Only)  Admission Status Involuntary  Psychosocial Assessment  Patient Complaints Anxiety  Eye Contact Brief  Facial Expression Animated  Affect Appropriate to circumstance  Speech Logical/coherent  Interaction Assertive  Motor Activity Other (Comment) (Unremarkable.)  Appearance/Hygiene Unremarkable  Behavior Characteristics Appropriate to situation  Mood Pleasant;Anxious  Thought Process  Coherency WDL  Content WDL  Delusions None reported or observed  Perception WDL  Hallucination None reported or observed  Judgment Impaired  Confusion None  Danger to Self  Current suicidal ideation? Denies  Danger to Others  Danger to Others None reported or observed

## 2023-02-15 NOTE — BHH Group Notes (Signed)
B[facility] Group Notes:  (Nursing/MHT/Case Management/Adjunct)  Date:  02/15/2023  Time: 1000  Type of Therapy:  Psychoeducational Skills  Participation Level:  na  Participation Quality:    Affect:    Cognitive:   Insight:    Engagement in Group:    Modes of Intervention:  Discussion, Education, and Exploration  Summary of Progress/Problems: Patients were given two poems to read, one titled ''watch your thoughts for they become your actions'' and another '' the owl and the chimpanzee'' by Jo Camacho. Patients were given education on positive reframing and asked to identify one negative belief about themselves they would like to heal from to impact positive mental well being. Discussion, education and education of healthy coping skills were also discussed in group. Pt did not attend.    Nasha Diss 02/15/2023, 11:11 AM 

## 2023-02-15 NOTE — Group Note (Signed)
B[facility] Group Notes: (Clinical Social Work)   02/15/2023      Type of Therapy:  Group Therapy   Participation Level:  Did Not Attend - was invited both individually by MHT and by overhead announcement, chose not to attend.   Ambrose Mantle, LCSW 02/15/2023, 2:10 PM

## 2023-02-16 ENCOUNTER — Encounter (HOSPITAL_COMMUNITY): Payer: Self-pay

## 2023-02-16 LAB — LAMOTRIGINE LEVEL: Lamotrigine Lvl: 21.9 ug/mL — ABNORMAL HIGH (ref 2.0–20.0)

## 2023-02-16 NOTE — Progress Notes (Incomplete)
   02/16/23 2300  Psych Admission Type (Psych Patients Only)  Admission Status Involuntary  Psychosocial Assessment  Patient Complaints None  Eye Contact Fair  Facial Expression Other (Comment) (wdl)  Affect Other (Comment) (wdl)  Speech Logical/coherent  Interaction Assertive  Motor Activity Other (Comment) (wdl)  Appearance/Hygiene Unremarkable  Behavior Characteristics Cooperative;Appropriate to situation  Mood Pleasant  Thought Process  Coherency WDL  Content WDL  Delusions None reported or observed  Perception WDL  Hallucination None reported or observed  Judgment WDL  Confusion None  Danger to Self  Current suicidal ideation? Denies  Agreement Not to Harm Self Yes  Description of Agreement verbal  Danger to Others  Danger to Others None reported or observed

## 2023-02-16 NOTE — Progress Notes (Signed)
   02/16/23 0800  Psych Admission Type (Psych Patients Only)  Admission Status Involuntary  Psychosocial Assessment  Patient Complaints None  Eye Contact Brief  Facial Expression Animated  Affect Appropriate to circumstance  Speech Logical/coherent  Interaction Minimal  Motor Activity Other (Comment) (WDL)  Appearance/Hygiene Unremarkable  Behavior Characteristics Appropriate to situation  Mood Pleasant  Thought Process  Coherency WDL  Content WDL  Delusions None reported or observed  Perception WDL  Hallucination None reported or observed  Judgment Impaired  Confusion None  Danger to Self  Current suicidal ideation? Denies  Danger to Others  Danger to Others None reported or observed

## 2023-02-16 NOTE — BH IP Treatment Plan (Signed)
Interdisciplinary Treatment and Diagnostic Plan Update  02/16/2023 Time of Session: 11:15 AM  Jenny Chan MRN: [id number]  Principal Diagnosis: MDD (major depressive disorder)  Secondary Diagnoses: Active Problems:   Severe episode of recurrent major depressive disorder, without psychotic features (HCC)   Social anxiety disorder   Current Medications:  Current Facility-Administered Medications  Medication Dose Route Frequency Provider Last Rate Last Admin   acetaminophen (TYLENOL) tablet 650 mg  650 mg Oral Q6H PRN Augusto Gamble, MD       And   hydrOXYzine (ATARAX) tablet 25 mg  25 mg Oral TID PRN Augusto Gamble, MD   25 mg at 02/15/23 1446   And   bismuth subsalicylate (PEPTO BISMOL) chewable tablet 524 mg  524 mg Oral Q3H PRN Augusto Gamble, MD       And   senna (SENOKOT) tablet 8.6 mg  1 tablet Oral QHS PRN Augusto Gamble, MD       And   polyethylene glycol (MIRALAX / GLYCOLAX) packet 17 g  17 g Oral Daily PRN Augusto Gamble, MD       And   ondansetron [facility]) tablet 8 mg  8 mg Oral Q8H PRN Augusto Gamble, MD       And   alum & mag hydroxide-simeth (MAALOX/MYLANTA) 200-200-20 MG/5ML suspension 30 mL  30 mL Oral Q4H PRN Augusto Gamble, MD       diphenhydrAMINE (BENADRYL) capsule 50 mg  50 mg Oral TID PRN Motley-Mangrum, Geralynn Ochs A, PMHNP       Or   diphenhydrAMINE (BENADRYL) injection 50 mg  50 mg Intramuscular TID PRN Motley-Mangrum, Jadeka A, PMHNP       haloperidol (HALDOL) tablet 5 mg  5 mg Oral TID PRN Motley-Mangrum, Jadeka A, PMHNP       Or   haloperidol lactate (HALDOL) injection 5 mg  5 mg Intramuscular TID PRN Motley-Mangrum, Jadeka A, PMHNP       LORazepam (ATIVAN) tablet 2 mg  2 mg Oral TID PRN Motley-Mangrum, Jadeka A, PMHNP       Or   LORazepam (ATIVAN) injection 2 mg  2 mg Intramuscular TID PRN Motley-Mangrum, Jadeka A, PMHNP       sertraline (ZOLOFT) tablet 100 mg  100 mg Oral Daily Augusto Gamble, MD   100 mg at 02/16/23 0827   traZODone (DESYREL) tablet 50 mg  50 mg Oral QHS PRN  Augusto Gamble, MD       PTA Medications: Medications Prior to Admission  Medication Sig Dispense Refill Last Dose   acetaminophen (TYLENOL) 500 MG tablet Take 500 mg by mouth every 6 (six) hours as needed for moderate pain.      ibuprofen (ADVIL) 800 MG tablet Take 1,600 mg by mouth as needed.      lamoTRIgine (LAMICTAL) 100 MG tablet Take 100 mg by mouth 2 (two) times daily.      Multiple Vitamins-Minerals (MULTIVITAMIN WITH MINERALS) tablet Take 2 tablets by mouth daily.       Patient Stressors:    Patient Strengths:    Treatment Modalities: Medication Management, Group therapy, Case management,  1 to 1 session with clinician, Psychoeducation, Recreational therapy.   Physician Treatment Plan for Primary Diagnosis: MDD (major depressive disorder) Long Term Goal(s): Improvement in symptoms so as ready for discharge   Short Term Goals: Ability to identify changes in lifestyle to reduce recurrence of condition will improve Ability to verbalize feelings will improve Ability to disclose and discuss suicidal ideas Ability to demonstrate self-control will improve  Ability to identify and develop effective coping behaviors will improve Compliance with prescribed medications will improve Ability to identify triggers associated with substance abuse/mental health issues will improve  Medication Management: Evaluate patient's response, side effects, and tolerance of medication regimen.  Therapeutic Interventions: 1 to 1 sessions, Unit Group sessions and Medication administration.  Evaluation of Outcomes: Progressing  Physician Treatment Plan for Secondary Diagnosis: Active Problems:   Severe episode of recurrent major depressive disorder, without psychotic features (HCC)   Social anxiety disorder  Long Term Goal(s): Improvement in symptoms so as ready for discharge   Short Term Goals: Ability to identify changes in lifestyle to reduce recurrence of condition will improve Ability to  verbalize feelings will improve Ability to disclose and discuss suicidal ideas Ability to demonstrate self-control will improve Ability to identify and develop effective coping behaviors will improve Compliance with prescribed medications will improve Ability to identify triggers associated with substance abuse/mental health issues will improve     Medication Management: Evaluate patient's response, side effects, and tolerance of medication regimen.  Therapeutic Interventions: 1 to 1 sessions, Unit Group sessions and Medication administration.  Evaluation of Outcomes: Progressing   RN Treatment Plan for Primary Diagnosis: MDD (major depressive disorder) Long Term Goal(s): Knowledge of disease and therapeutic regimen to maintain health will improve  Short Term Goals: Ability to remain free from injury will improve, Ability to verbalize frustration and anger appropriately will improve, Ability to demonstrate self-control, Ability to participate in decision making will improve, Ability to verbalize feelings will improve, Ability to disclose and discuss suicidal ideas, Ability to identify and develop effective coping behaviors will improve, and Compliance with prescribed medications will improve  Medication Management: RN will administer medications as ordered by provider, will assess and evaluate patient's response and provide education to patient for prescribed medication. RN will report any adverse and/or side effects to prescribing provider.  Therapeutic Interventions: 1 on 1 counseling sessions, Psychoeducation, Medication administration, Evaluate responses to treatment, Monitor vital signs and CBGs as ordered, Perform/monitor CIWA, COWS, AIMS and Fall Risk screenings as ordered, Perform wound care treatments as ordered.  Evaluation of Outcomes: Progressing   LCSW Treatment Plan for Primary Diagnosis: MDD (major depressive disorder) Long Term Goal(s): Safe transition to appropriate next  level of care at discharge, Engage patient in therapeutic group addressing interpersonal concerns.  Short Term Goals: Engage patient in aftercare planning with referrals and resources, Increase social support, Increase ability to appropriately verbalize feelings, Increase emotional regulation, Facilitate acceptance of mental health diagnosis and concerns, Facilitate patient progression through stages of change regarding substance use diagnoses and concerns, and Identify triggers associated with mental health/substance abuse issues  Therapeutic Interventions: Assess for all discharge needs, 1 to 1 time with Social worker, Explore available resources and support systems, Assess for adequacy in community support network, Educate family and significant other(s) on suicide prevention, Complete Psychosocial Assessment, Interpersonal group therapy.  Evaluation of Outcomes: Progressing   Progress in Treatment: Attending groups: Yes. Participating in groups: Yes. Taking medication as prescribed: Yes. Toleration medication: Yes. Family/Significant other contact made: No, will contact:  [name] [phone]  Patient understands diagnosis: Yes. Discussing patient identified problems/goals with staff: Yes. Medical problems stabilized or resolved: Yes. Denies suicidal/homicidal ideation: Yes. Issues/concerns per patient self-inventory: No.   New problem(s) identified: No, Describe:  None reported   New Short Term/Long Term Goal(s):medication stabilization, elimination of SI thoughts, development of comprehensive mental wellness plan.    Patient Goals:  " get on the right  medication to control my depression and self-harm "   Discharge Plan or Barriers: Patient recently admitted. CSW will continue to follow and assess for appropriate referrals and possible discharge planning.    Reason for Continuation of Hospitalization: Anxiety Depression Medication stabilization Suicidal  ideation  Estimated Length of Stay: 3-5 days   Last 3 [place] Suicide Severity Risk Score: Flowsheet Row ED from 02/12/2023 in [facility] at [facility] ED from 05[facility]08/2022 in [facility] ED from 02/25/2021 in [facility] RISK CATEGORY High Risk No Risk Error: Question 6 not populated       Last [facility] 2/9 Scores:    12/31/2022    9:16 AM 05/08/2022    8:01 AM  Depression screen PHQ 2/9  Decreased Interest 0 2  Down, Depressed, Hopeless 0 3  PHQ - 2 Score 0 5  Altered sleeping 1 3  Tired, decreased energy 3 2  Change in appetite 0 0  Feeling bad or failure about yourself  0 2  Trouble concentrating 3 0  Moving slowly or fidgety/restless 3 2  Suicidal thoughts 0 2  PHQ-9 Score 10 16  Difficult doing work/chores Very difficult Very difficult    Scribe for Treatment Team: Beather Arbour 02/16/2023 3:07 PM

## 2023-02-16 NOTE — Group Note (Signed)
Occupational Therapy Group Note  Group Topic: Sleep Hygiene  Group Date: 02/16/2023 Start Time: 1430 End Time: 1505 Facilitators: Ted Mcalpine, OT   Group Description: Group encouraged increased participation and engagement through topic focused on sleep hygiene. Patients reflected on the quality of sleep they typically receive and identified areas that need improvement. Group was given background information on sleep and sleep hygiene, including common sleep disorders. Group members also received information on how to improve one's sleep and introduced a sleep diary as a tool that can be utilized to track sleep quality over a length of time. Group session ended with patients identifying one or more strategies they could utilize or implement into their sleep routine in order to improve overall sleep quality.        Therapeutic Goal(s):  Identify one or more strategies to improve overall sleep hygiene  Identify one or more areas of sleep that are negatively impacted (sleep too much, too little, etc)     Participation Level: Engaged   Participation Quality: Independent   Behavior: Appropriate   Speech/Thought Process: Relevant   Affect/Mood: Appropriate   Insight: Good   Judgement: Good      Modes of Intervention: Education  Patient Response to Interventions:  Attentive   Plan: Continue to engage patient in OT groups 2 - 3x/week.  02/16/2023  Ted Mcalpine, OT  Kerrin Champagne, OT

## 2023-02-16 NOTE — Plan of Care (Signed)
  Problem: Education: Goal: Knowledge of [facility] [facility] Education information/materials will improve Outcome: Progressing Goal: Emotional status will improve Outcome: Progressing Goal: Mental status will improve Outcome: Progressing Goal: Verbalization of understanding the information provided will improve Outcome: Progressing   Problem: Safety: Goal: Periods of time without injury will increase Outcome: Progressing   Problem: Safety: Goal: Periods of time without injury will increase Outcome: Progressing

## 2023-02-16 NOTE — Progress Notes (Signed)
[facility] MD Progress Note  02/16/2023 9:46 AM Jenny Chan  MRN:  [id number]  Principal Problem: MDD (major depressive disorder) Diagnosis: Active Problems:   Severe episode of recurrent major depressive disorder, without psychotic features (HCC)   Social anxiety disorder   Reason for Admission:  Jenny Chan is a 27 y.o. woman with a past psychiatric history significant for bipolar disorder and no significant PMHx who presents to the [facility] Involuntary from [facility] Emergency Department for evaluation and management of suicidal ideations with overdose attempt on over 100 lamotrigine tablets and slitting bilateral wrists with razors.  (admitted on 02/13/2023, total  LOS: 3 days )  Chart Review from last 24 hours:  The patient's chart was reviewed and nursing notes were reviewed. The patient's case was discussed in multidisciplinary team meeting.   - Overnight events to report per chart review: no overnight events to report - Patient received all scheduled medications - Patient received the following PRN medications: hydroxyzine  Information Obtained Today During Patient Interview: The patient was seen and evaluated on the unit. On assessment today the patient reports feeling good she attributes to the feeling that was prescribed for her.  She did endorse some jitteriness yesterday after receiving her dose and also was sleepy during the day.  Those symptoms, however, have subsided.  She understands that we are continuing to monitor her in case she develops hypo-/mania from the SSRI.  She says her plan after discharge is to go back to live with her mom.  She is interested in seeing a psychiatrist for continued care.  Patient endorses good sleep; endorses good appetite.  Patient does not endorse any side-effects they attribute to medications. Patient does not endorse any somatic complaints  Past Psychiatric History:  Previous psych diagnoses:  endorses prior  diagnosis of bipolar Prior inpatient psychiatric treatment:  admitted x2 .Once at age 52 for suicide attempt (cut wrists),  once again in 2022 for suicidal thoughts. Current/prior outpatient psychiatric treatment:  [facility] Current psychiatric provider:  patient does not know   Neuromodulation history: denies   Current therapist:  none, patient stopped going Psychotherapy hx:  previously seen at [facility]   History of suicide attempts:  see above History of homicide: Denies Past Medical History:  Past Medical History:  Diagnosis Date   Seizure-like activity (HCC)    Family History:  Family History  Problem Relation Age of Onset   Healthy Mother    Healthy Father    Family History:  Medical: grandmother on mom's side had diabetes Psych: unknown Psych rx: unknown Suicide: denies Homicide: denies Substance use family hx: denies   Social History:  Place of birth and grew up where: born in [place], grew up in [place] Abuse: no history of abuse Marital Status: single Sexual orientation: bisexual Children: 47 year old daughter Employment: unemployed, previously worked at [employer] 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 Highest level of education: high school Housing: living with mom Finances: 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) Legal: no Special educational needs teacher: never served Consulting civil engineer: no guns in house Pills stockpile: denies  Current Medications: Current Facility-Administered Medications  Medication Dose Route Frequency Provider Last Rate Last Admin   acetaminophen (TYLENOL) tablet 650 mg  650 mg Oral Q6H PRN Augusto Gamble, MD       And   hydrOXYzine (ATARAX) tablet 25 mg  25 mg Oral TID PRN Augusto Gamble, MD   25 mg at 02/15/23 1446   And  bismuth subsalicylate (PEPTO BISMOL) chewable tablet 524 mg  524 mg Oral Q3H PRN Augusto Gamble, MD       And   senna (SENOKOT) tablet 8.6 mg  1 tablet Oral QHS PRN Augusto Gamble, MD       And    polyethylene glycol (MIRALAX / GLYCOLAX) packet 17 g  17 g Oral Daily PRN Augusto Gamble, MD       And   ondansetron [facility]) tablet 8 mg  8 mg Oral Q8H PRN Augusto Gamble, MD       And   alum & mag hydroxide-simeth (MAALOX/MYLANTA) 200-200-20 MG/5ML suspension 30 mL  30 mL Oral Q4H PRN Augusto Gamble, MD       diphenhydrAMINE (BENADRYL) capsule 50 mg  50 mg Oral TID PRN Motley-Mangrum, Geralynn Ochs A, PMHNP       Or   diphenhydrAMINE (BENADRYL) injection 50 mg  50 mg Intramuscular TID PRN Motley-Mangrum, Jadeka A, PMHNP       haloperidol (HALDOL) tablet 5 mg  5 mg Oral TID PRN Motley-Mangrum, Jadeka A, PMHNP       Or   haloperidol lactate (HALDOL) injection 5 mg  5 mg Intramuscular TID PRN Motley-Mangrum, Jadeka A, PMHNP       LORazepam (ATIVAN) tablet 2 mg  2 mg Oral TID PRN Motley-Mangrum, Jadeka A, PMHNP       Or   LORazepam (ATIVAN) injection 2 mg  2 mg Intramuscular TID PRN Motley-Mangrum, Jadeka A, PMHNP       sertraline (ZOLOFT) tablet 100 mg  100 mg Oral Daily Augusto Gamble, MD   100 mg at 02/16/23 0827   traZODone (DESYREL) tablet 50 mg  50 mg Oral QHS PRN Augusto Gamble, MD        Lab Results:  Results for orders placed or performed during the hospital encounter of 02/13/23 (from the past 48 hour(s))  Pregnancy, urine     Status: None   Collection Time: 02/14/23  4:07 PM  Result Value Ref Range   Preg Test, Ur NEGATIVE NEGATIVE    Comment:        THE SENSITIVITY OF THIS METHODOLOGY IS >25 mIU/mL. Performed at [facility], [address] [place], [place] [address]   Basic metabolic panel     Status: None   Collection Time: 02/15/23  6:40 AM  Result Value Ref Range   Sodium 137 135 - 145 mmol/L   Potassium 3.8 3.5 - 5.1 mmol/L   Chloride 105 98 - 111 mmol/L   CO2 24 22 - 32 mmol/L   Glucose, Bld 96 70 - 99 mg/dL    Comment: Glucose reference range applies only to samples taken after fasting for at least 8 hours.   BUN 9 6 - 20 mg/dL   Creatinine, Ser 6.04 0.44 - 1.00  mg/dL   Calcium 8.9 8.9 - 54.0 mg/dL   GFR, Estimated >98 >11 mL/min    Comment: (NOTE) Calculated using the CKD-EPI Creatinine Equation (2021)    Anion gap 8 5 - 15    Comment: Performed at [facility], [address] [place], [place] [address]    Blood Alcohol level:  Lab Results  Component Value Date   [place] <5 10/05/2015    Metabolic Labs: Lab Results  Component Value Date   HGBA1C 5.2 02/14/2023   MPG 102.54 02/14/2023   MPG 102.54 05/08/2022   No results found for: "PROLACTIN" Lab Results  Component Value Date   CHOL 151  02/14/2023   TRIG 46 02/14/2023   HDL 49 02/14/2023   CHOLHDL 3.1 02/14/2023   VLDL 9 02/14/2023   LDLCALC 93 02/14/2023   LDLCALC 128 (H) 05/08/2022    Physical Findings: AIMS: No  CIWA:    COWS:     Psychiatric Specialty Exam: Presentation  General Appearance: Appropriate for Environment; Fairly Groomed   Eye Contact:Good   Speech:Clear and Coherent   Speech Volume:Normal   Handedness:No data recorded   Mood and Affect  Mood:Dysphoric   Affect:Appropriate; Congruent; Full Range    Thought Process  Thought Processes:Coherent; Goal Directed; Linear   Descriptions of Associations:Intact   Orientation:Full (Time, Place and Person)   Thought Content:WDL      Hallucinations:No data recorded  Ideas of Reference:None   Suicidal Thoughts:No data recorded  Homicidal Thoughts:No data recorded   Sensorium  Memory:Immediate Good; Recent Good; Remote Good   Judgment:Good   Insight:Good    Executive Functions  Concentration:Good   Attention Span:Good   Recall:Good   Fund of Knowledge:Good   Language:Good    Psychomotor Activity  Psychomotor Activity:No data recorded   Assets  Assets:Communication Skills; Desire for Improvement; Social Support; Financial Resources/Insurance    Sleep  Sleep:No data recorded   Nutritional Assessment (For OBS and FBC admissions  only) Has the patient recently lost weight without trying?: 0 Has the patient been eating poorly because of a decreased appetite?: 0 Malnutrition Screening Tool Score: 0    ROS and Physical Exam Review of Systems  Constitutional: Negative.   Respiratory: Negative.    Cardiovascular: Negative.   Gastrointestinal: Negative.   Genitourinary: Negative.     Blood pressure (!) 101/58, pulse 93, temperature (!) 97.4 F (36.3 C), temperature source Oral, resp. rate 16, SpO2 100 %, unknown if currently breastfeeding. There is no height or weight on file to calculate BMI. Physical Exam HENT:     Head: Normocephalic.  Pulmonary:     Effort: Pulmonary effort is normal.  Neurological:     General: No focal deficit present.     Mental Status: She is alert.    Assets  Assets: Manufacturing systems engineer; Desire for Improvement; Social Support; Financial Resources/Insurance   Treatment Plan Summary: Daily contact with patient to assess and evaluate symptoms and progress in treatment and Medication management  Diagnoses / Active Problems: MDD (major depressive disorder) Active Problems:   Severe episode of recurrent major depressive disorder, without psychotic features (HCC)   Social anxiety disorder   ASSESSMENT: Patient affect seems significantly improved today, having a bright quality.  She no longer appears dysphoric or tearful.  She did endorse some side effects of jitteriness and drowsiness yesterday to her prescribed sertraline, but is no longer experiencing that today.  There continues to be a slight concern for possible flip to a hypo-/manic state with the prescribed SSRI, so patient will need to be continually monitored.  PLAN: Safety and Monitoring:  -- Involuntary admission to inpatient psychiatric unit for safety, stabilization and treatment  -- Daily contact with patient to assess and evaluate symptoms and progress in treatment  -- Patient's case to be discussed in  multi-disciplinary team meeting  -- Observation Level : q15 minute checks  -- Vital signs:  q12 hours  -- Precautions: suicide, elopement, and assault  2. Interventions (medications, psychoeducation, etc):              -- Continue sertraline 100 mg daily to treat depressive symptoms and social anxiety disorder   -- Patient does not  need nicotine replacement  PRN medications for symptomatic management:              -- start acetaminophen 650 mg every 6 hours as needed for mild to moderate pain, fever, and headaches              -- start hydroxyzine 25 mg three times a day as needed for anxiety              -- start bismuth subsalicylate 524 mg oral chewable tablet every 3 hours as needed for diarrhea / loose stools              -- start senna 8.6 mg oral at bedtime and polyethylene glycol 17 g oral daily as needed for mild to moderate constipation              -- start ondansetron 8 mg every 8 hours as needed for nausea or vomiting              -- start aluminum-magnesium hydroxide + simethicone 30 mL every 4 hours as needed for heartburn or indigestion             -- start trazodone 50 mg at bedtime as needed for insomnia  The risks/benefits/side-effects/alternatives to the above medication were discussed in detail with the patient and time was given for questions. The patient consents to medication trial. FDA black box warnings, if present, were discussed.  The patient is agreeable with the medication plan, as above. We will monitor the patient's response to pharmacologic treatment, and adjust medications as necessary.  3. Routine and other pertinent labs:             -- Metabolic profile:  BMI: There is no height or weight on file to calculate BMI.  Prolactin: No results found for: "PROLACTIN"  Lipid Panel: Lab Results  Component Value Date   CHOL 151 02/14/2023   TRIG 46 02/14/2023   HDL 49 02/14/2023   CHOLHDL 3.1 02/14/2023   VLDL 9 02/14/2023   LDLCALC 93 02/14/2023    LDLCALC 128 (H) 05/08/2022    HbgA1c: Hgb A1c MFr Bld (%)  Date Value  02/14/2023 5.2    TSH: TSH (uIU/mL)  Date Value  02/14/2023 1.398    EKG monitoring: QTc: 432 (02/13/2023)   4. Group Therapy:  -- Encouraged patient to participate in unit milieu and in scheduled group therapies   -- Short Term Goals: Ability to identify changes in lifestyle to reduce recurrence of condition, verbalize feelings, identify and develop effective coping behaviors, maintain clinical measurements within normal limits, and identify triggers associated with substance abuse/mental health issues will improve. Improvement in ability to disclose and discuss suicidal ideas and demonstrate self-control.  Ability to identify changes in lifestyle to reduce recurrence of condition will improve, Ability to verbalize feelings will improve, Ability to disclose and discuss suicidal ideas, Ability to demonstrate self-control will improve, Ability to identify and develop effective coping behaviors will improve, Compliance with prescribed medications will improve, and Ability to identify triggers associated with substance abuse/mental health issues will improve  -- Long Term Goals: Improvement in symptoms so as ready for discharge -- Patient is encouraged to participate in group therapy while admitted to the psychiatric unit. -- We will address other chronic and acute stressors, which contributed to the patient's MDD (major depressive disorder) in order to reduce the risk of self-harm at discharge.  5. Discharge Planning:   -- Social work and case management to assist  with discharge planning and identification of hospital follow-up needs prior to discharge  -- Estimated LOS: 5-7 days  -- Discharge Concerns: Need to establish a safety plan; Medication compliance and effectiveness  -- Discharge Goals: Return home with outpatient referrals for mental health follow-up including medication management/psychotherapy  I certify  that inpatient services furnished can reasonably be expected to improve the patient's condition.    I discussed my assessment, planned testing and intervention for the patient with Dr. Enedina Finner who agrees with my formulated course of action.  Signed: Augusto Gamble, MD 02/16/2023, 9:46 AM

## 2023-02-16 NOTE — [facility] Group Notes (Signed)
Spiritual care group on grief and loss facilitated by Chaplain Dyanne Carrel, [facility]  Group Goal: Support / Education around grief and loss  Members engage in facilitated group support and psycho-social education.  Group Description:  Following introductions and group rules, group members engaged in facilitated group dialogue and support around topic of loss, with particular support around experiences of loss in their lives. Group Identified types of loss (relationships / self / things) and identified patterns, circumstances, and changes that precipitate losses. Reflected on thoughts / feelings around loss, normalized grief responses, and recognized variety in grief experience. Group encouraged individual reflection on safe space and on the coping skills that they are already utilizing.  Group drew on Adlerian / Rogerian and narrative framework  Patient Progress: Pt attended and actively engaged and participated in group conversation and activities.  Comments demonstrated good insight into the topic and pt was supportive of peers.

## 2023-02-16 NOTE — Group Note (Signed)
Recreation Therapy Group Note   Group Topic:Team Building  Group Date: 02/16/2023 Start Time: 0935 End Time: 1006 Facilitators: Keyarra Rendall-McCall, LRT,CTRS Location: [address] Day[address]   Goal Area(s) Addresses:  Patient will effectively work with peer towards shared goal.  Patient will identify skills used to make activity successful.  Patient will identify how skills used during activity can be applied to reach post d/c goals.    Group Description: [employer]. In teams of 5-6, patients were given 11 craft pipe cleaners. Using the materials provided, patients were instructed to compete again the opposing team(s) to build the tallest free-standing structure from floor level. The activity was timed; difficulty increased by Clinical research associate as Production designer, theatre/television/film continued.  Systematically resources were removed with additional directions for example, placing one arm behind their back, working in silence, and shape stipulations. LRT facilitated post-activity discussion reviewing team processes and necessary communication skills involved in completion. Patients were encouraged to reflect how the skills utilized, or not utilized, in this activity can be incorporated to positively impact support systems post discharge.   Affect/Mood: Appropriate   Participation Level: Engaged   Participation Quality: Independent   Behavior: Appropriate   Speech/Thought Process: Focused   Insight: Good   Judgement: Good   Modes of Intervention: Team-building   Patient Response to Interventions:  Engaged   Education Outcome:  Acknowledges education   Clinical Observations/Individualized Feedback: Pt attended, participated and worked well with peers in completing activity.    Plan: Continue to engage patient in RT group sessions 2-3x/week.   Zofia Peckinpaugh-McCall, LRT,CTRS  02/16/2023 11:29 AM

## 2023-02-17 DIAGNOSIS — F329 Major depressive disorder, single episode, unspecified: Secondary | ICD-10-CM | POA: Diagnosis not present

## 2023-02-17 MED ORDER — BACITRACIN-NEOMYCIN-POLYMYXIN OINTMENT TUBE
TOPICAL_OINTMENT | Freq: Two times a day (BID) | CUTANEOUS | Status: DC
  Filled 2023-02-17: qty 14.17

## 2023-02-17 NOTE — Progress Notes (Addendum)
Pt denied SI/HI/AVH this morning. Pt rated her depression a 5/10, anxiety a 3/10, and feelings of hopelessness a 6/10. Pt reports that she slept "fair" last night. Pt has been pleasant, calm, and cooperative throughout the shift. RN provided support and encouragement to patient. Pt given scheduled medications as prescribed. Q15 min checks verified for safety. Patient verbally contracts for safety. Patient compliant with medications and treatment plan. Patient is interacting well on the unit. Pt is safe on the unit.   02/17/23 1000  Psych Admission Type (Psych Patients Only)  Admission Status Involuntary  Psychosocial Assessment  Patient Complaints None  Eye Contact Fair  Facial Expression Flat  Affect Anxious  Speech Logical/coherent  Interaction Assertive  Motor Activity Other (Comment) (WDL)  Appearance/Hygiene Unremarkable  Behavior Characteristics Cooperative;Appropriate to situation  Mood Pleasant  Thought Process  Coherency WDL  Content WDL  Delusions None reported or observed  Perception WDL  Hallucination None reported or observed  Judgment Impaired  Confusion None  Danger to Self  Current suicidal ideation? Denies  Agreement Not to Harm Self Yes  Description of Agreement Pt verbally contracts for safety  Danger to Others  Danger to Others None reported or observed

## 2023-02-17 NOTE — Group Note (Signed)
Recreation Therapy Group Note   Group Topic:Animal Assisted Therapy   Group Date: 02/17/2023 Start Time: 0950 End Time: 1030 Facilitators: Lakesha Levinson-McCall, LRT,CTRS Location: [address]   Animal-Assisted Activity (AAA) Program Checklist/Progress Notes Patient Eligibility Criteria Checklist & Daily Group note for Rec Tx Intervention  AAA/T Program Assumption of Risk Form signed by Patient/ or Parent Legal Guardian Yes  Patient is free of allergies or severe asthma Yes  Patient reports no fear of animals Yes  Patient reports no history of cruelty to animals Yes  Patient understands his/her participation is voluntary Yes  Patient washes hands before animal contact Yes  Patient washes hands after animal contact Yes   Affect/Mood: Appropriate   Participation Level: Engaged   Participation Quality: Independent   Behavior: Appropriate    Clinical Observations/Individualized Feedback:  Patient attended session and interacted appropriately with therapy dog and peers. Patient asked appropriate questions about therapy dog and his training. Patient shared stories about their pets at home with group.    Plan: Continue to engage patient in RT group sessions 2-3x/week.   Jenny Chan, LRT,CTRS 02/17/2023 12:56 PM

## 2023-02-17 NOTE — [facility] Group Notes (Signed)
The focus of this group is to help patients establish daily goals to achieve during treatment and discuss how the patient can incorporate goal setting into their daily lives to aide in recovery.    Scale 1-10 7 out of 10    Goal: Work on discharge plan 

## 2023-02-17 NOTE — Group Note (Signed)
Date:  02/17/2023 Time:  12:05 PM  Group Topic/Focus:  Rediscovering Joy:   The focus of this group is to explore various ways to relieve stress in a positive manner.    Participation Level:  Active  Participation Quality:  Appropriate  Affect:  Appropriate  Cognitive:  Appropriate  Insight: Appropriate  Engagement in Group:  Engaged  Modes of Intervention:  Rapport Building, Socialization, and Support  Additional Comments:    Memory Dance Kimika Streater 02/17/2023, 12:05 PM

## 2023-02-17 NOTE — [facility] Group Notes (Signed)
[facility] Group Notes:  (Nursing/MHT/Case Management/Adjunct)  Date:  02/17/2023  Time:  2000  Type of Therapy:   wrap up group  Participation Level:  Active  Participation Quality:  Appropriate, Attentive, Sharing, and Supportive  Affect:  Appropriate  Cognitive:  Alert  Insight:  Improving  Engagement in Group:  Engaged  Modes of Intervention:  Clarification, Education, and Support  Summary of Progress/Problems: Positive thinking and positive change were discussed.   Jenny Chan 02/17/2023, 9:29 PM

## 2023-02-17 NOTE — Progress Notes (Signed)
[facility] MD Progress Note  02/17/2023 7:22 AM Jenny Chan  MRN:  [id number]  Principal Problem: MDD (major depressive disorder) Diagnosis: Active Problems:   Severe episode of recurrent major depressive disorder, without psychotic features (HCC)   Social anxiety disorder   Reason for Admission:  Jenny Chan is a 27 y.o. woman with a past psychiatric history significant for bipolar disorder and no significant PMHx who presents to the [facility] Involuntary from [facility] Emergency Department for evaluation and management of suicidal ideations with overdose attempt on over 100 lamotrigine tablets and slitting bilateral wrists with razors.  (admitted on 02/13/2023, total  LOS: 4 days )  Chart Review from last 24 hours:  The patient's chart was reviewed and nursing notes were reviewed. The patient's case was discussed in multidisciplinary team meeting.   - Overnight events to report per chart review: no overnight events to report - Patient received all scheduled medications - Patient received the following PRN medications: hydroxyzine  Information Obtained Today During Patient Interview: The patient was seen and evaluated on the unit. On assessment today the patient reports feeling good with her depression reportedly at 3/10.  She does endorse elevated mood, but has not experienced decreased need for sleep or racing thoughts.  Patient endorses good sleep; endorses good appetite.  Patient does not endorse any side-effects they attribute to medications. Patient does not endorse any somatic complaints  Past Psychiatric History:  Previous psych diagnoses:  endorses prior diagnosis of bipolar Prior inpatient psychiatric treatment:  admitted x2 .Once at age 39 for suicide attempt (cut wrists),  once again in 2022 for suicidal thoughts. Current/prior outpatient psychiatric treatment:  [facility] Current psychiatric provider:  patient does not know   Neuromodulation  history: denies   Current therapist:  none, patient stopped going Psychotherapy hx:  previously seen at [facility]   History of suicide attempts:  see above History of homicide: Denies Past Medical History:  Past Medical History:  Diagnosis Date   Seizure-like activity (HCC)    Family History:  Family History  Problem Relation Age of Onset   Healthy Mother    Healthy Father    Family History:  Medical: grandmother on mom's side had diabetes Psych: unknown Psych rx: unknown Suicide: denies Homicide: denies Substance use family hx: denies   Social History:  Place of birth and grew up where: born in [place], grew up in [place] Abuse: no history of abuse Marital Status: single Sexual orientation: bisexual Children: 62 year old daughter Employment: unemployed, previously worked at [employer] 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 Highest level of education: high school Housing: living with mom Finances: 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) Legal: no Special educational needs teacher: never served Consulting civil engineer: no guns in house Pills stockpile: denies  Current Medications: Current Facility-Administered Medications  Medication Dose Route Frequency Provider Last Rate Last Admin   acetaminophen (TYLENOL) tablet 650 mg  650 mg Oral Q6H PRN Augusto Gamble, MD       And   hydrOXYzine (ATARAX) tablet 25 mg  25 mg Oral TID PRN Augusto Gamble, MD   25 mg at 02/15/23 1446   And   bismuth subsalicylate (PEPTO BISMOL) chewable tablet 524 mg  524 mg Oral Q3H PRN Augusto Gamble, MD       And   senna (SENOKOT) tablet 8.6 mg  1 tablet Oral QHS PRN Augusto Gamble, MD       And   polyethylene glycol (MIRALAX / GLYCOLAX)  packet 17 g  17 g Oral Daily PRN Augusto Gamble, MD       And   ondansetron [facility]) tablet 8 mg  8 mg Oral Q8H PRN Augusto Gamble, MD       And   alum & mag hydroxide-simeth (MAALOX/MYLANTA) 200-200-20 MG/5ML suspension 30 mL  30 mL Oral Q4H PRN Augusto Gamble, MD        diphenhydrAMINE (BENADRYL) capsule 50 mg  50 mg Oral TID PRN Motley-Mangrum, Geralynn Ochs A, PMHNP       Or   diphenhydrAMINE (BENADRYL) injection 50 mg  50 mg Intramuscular TID PRN Motley-Mangrum, Jadeka A, PMHNP       haloperidol (HALDOL) tablet 5 mg  5 mg Oral TID PRN Motley-Mangrum, Jadeka A, PMHNP       Or   haloperidol lactate (HALDOL) injection 5 mg  5 mg Intramuscular TID PRN Motley-Mangrum, Jadeka A, PMHNP       LORazepam (ATIVAN) tablet 2 mg  2 mg Oral TID PRN Motley-Mangrum, Jadeka A, PMHNP       Or   LORazepam (ATIVAN) injection 2 mg  2 mg Intramuscular TID PRN Motley-Mangrum, Jadeka A, PMHNP       sertraline (ZOLOFT) tablet 100 mg  100 mg Oral Daily Augusto Gamble, MD   100 mg at 02/16/23 0827   traZODone (DESYREL) tablet 50 mg  50 mg Oral QHS PRN Augusto Gamble, MD        Lab Results:  No results found for this or any previous visit (from the past 48 hour(s)).   Blood Alcohol level:  Lab Results  Component Value Date   ETH <5 10/05/2015    Metabolic Labs: Lab Results  Component Value Date   HGBA1C 5.2 02/14/2023   MPG 102.54 02/14/2023   MPG 102.54 05/08/2022   No results found for: "PROLACTIN" Lab Results  Component Value Date   CHOL 151 02/14/2023   TRIG 46 02/14/2023   HDL 49 02/14/2023   CHOLHDL 3.1 02/14/2023   VLDL 9 02/14/2023   LDLCALC 93 02/14/2023   LDLCALC 128 (H) 05/08/2022    Physical Findings: AIMS: No  CIWA:    COWS:     Psychiatric Specialty Exam: Presentation  General Appearance: Appropriate for Environment   Eye Contact:Good   Speech:Clear and Coherent   Speech Volume:Normal   Handedness:No data recorded   Mood and Affect  Mood:Euthymic   Affect:Appropriate; Congruent; Full Range    Thought Process  Thought Processes:Coherent; Goal Directed; Linear   Descriptions of Associations:Intact   Orientation:Full (Time, Place and Person)   Thought Content:WDL      Hallucinations:Hallucinations: None  Ideas of  Reference:None   Suicidal Thoughts:Suicidal Thoughts: No  Homicidal Thoughts:Homicidal Thoughts: No   Sensorium  Memory:Immediate Good; Recent Good; Remote Good   Judgment:Good   Insight:Good    Executive Functions  Concentration:Good   Attention Span:Good   Recall:Good   Fund of Knowledge:Good   Language:Good    Psychomotor Activity  Psychomotor Activity:Psychomotor Activity: Normal   Assets  Assets:Communication Skills; Desire for Improvement; Social Support    Sleep  Sleep:Sleep: Good   No data recorded   ROS and Physical Exam Review of Systems  Constitutional: Negative.   Respiratory: Negative.    Cardiovascular: Negative.   Gastrointestinal: Negative.   Genitourinary: Negative.     Blood pressure (!) 106/53, pulse 95, temperature (!) 97.4 F (36.3 C), temperature source Oral, resp. rate 16, SpO2 100 %, unknown if currently breastfeeding. There is no height or  weight on file to calculate BMI. Physical Exam HENT:     Head: Normocephalic.  Pulmonary:     Effort: Pulmonary effort is normal.  Neurological:     General: No focal deficit present.     Mental Status: She is alert.    Assets  Assets: Manufacturing systems engineer; Desire for Improvement; Social Support   Treatment Plan Summary: Daily contact with patient to assess and evaluate symptoms and progress in treatment and Medication management  Diagnoses / Active Problems: MDD (major depressive disorder) Active Problems:   Severe episode of recurrent major depressive disorder, without psychotic features (HCC)   Social anxiety disorder   ASSESSMENT: Patient continues to have a bright quality to her affect.  She is not exhibiting any hypo-/manic symptoms.  Discussed with patient the resources she needs prior to discharge, including additional support care for her child.  Planned discharge for tomorrow.  PLAN: Safety and Monitoring:  -- Involuntary admission to inpatient  psychiatric unit for safety, stabilization and treatment  -- Daily contact with patient to assess and evaluate symptoms and progress in treatment  -- Patient's case to be discussed in multi-disciplinary team meeting  -- Observation Level : q15 minute checks  -- Vital signs:  q12 hours  -- Precautions: suicide, elopement, and assault  2. Interventions (medications, psychoeducation, etc):              -- Continue sertraline 100 mg daily to treat depressive symptoms and social anxiety disorder   -- Patient does not need nicotine replacement  PRN medications for symptomatic management:              -- start acetaminophen 650 mg every 6 hours as needed for mild to moderate pain, fever, and headaches              -- start hydroxyzine 25 mg three times a day as needed for anxiety              -- start bismuth subsalicylate 524 mg oral chewable tablet every 3 hours as needed for diarrhea / loose stools              -- start senna 8.6 mg oral at bedtime and polyethylene glycol 17 g oral daily as needed for mild to moderate constipation              -- start ondansetron 8 mg every 8 hours as needed for nausea or vomiting              -- start aluminum-magnesium hydroxide + simethicone 30 mL every 4 hours as needed for heartburn or indigestion             -- start trazodone 50 mg at bedtime as needed for insomnia  The risks/benefits/side-effects/alternatives to the above medication were discussed in detail with the patient and time was given for questions. The patient consents to medication trial. FDA black box warnings, if present, were discussed.  The patient is agreeable with the medication plan, as above. We will monitor the patient's response to pharmacologic treatment, and adjust medications as necessary.  3. Routine and other pertinent labs:             -- Metabolic profile:  BMI: There is no height or weight on file to calculate BMI.  Prolactin: No results found for: "PROLACTIN"  Lipid  Panel: Lab Results  Component Value Date   CHOL 151 02/14/2023   TRIG 46 02/14/2023   HDL 49 02/14/2023  CHOLHDL 3.1 02/14/2023   VLDL 9 02/14/2023   LDLCALC 93 02/14/2023   LDLCALC 128 (H) 05/08/2022    HbgA1c: Hgb A1c MFr Bld (%)  Date Value  02/14/2023 5.2    TSH: TSH (uIU/mL)  Date Value  02/14/2023 1.398    EKG monitoring: QTc: 432 (02/13/2023)   4. Group Therapy:  -- Encouraged patient to participate in unit milieu and in scheduled group therapies   -- Short Term Goals: Ability to identify changes in lifestyle to reduce recurrence of condition, verbalize feelings, identify and develop effective coping behaviors, maintain clinical measurements within normal limits, and identify triggers associated with substance abuse/mental health issues will improve. Improvement in ability to disclose and discuss suicidal ideas and demonstrate self-control.  Ability to identify changes in lifestyle to reduce recurrence of condition will improve, Ability to verbalize feelings will improve, Ability to disclose and discuss suicidal ideas, Ability to demonstrate self-control will improve, Ability to identify and develop effective coping behaviors will improve, Compliance with prescribed medications will improve, and Ability to identify triggers associated with substance abuse/mental health issues will improve  -- Long Term Goals: Improvement in symptoms so as ready for discharge -- Patient is encouraged to participate in group therapy while admitted to the psychiatric unit. -- We will address other chronic and acute stressors, which contributed to the patient's MDD (major depressive disorder) in order to reduce the risk of self-harm at discharge.  5. Discharge Planning:   -- Social work and case management to assist with discharge planning and identification of hospital follow-up needs prior to discharge  -- Estimated LOS: 5-7 days  -- Discharge Concerns: Need to establish a safety plan;  Medication compliance and effectiveness  -- Discharge Goals: Return home with outpatient referrals for mental health follow-up including medication management/psychotherapy  I certify that inpatient services furnished can reasonably be expected to improve the patient's condition.    I discussed my assessment, planned testing and intervention for the patient with Dr.  Clovis Riley  who agrees with my formulated course of action.  Signed: Augusto Gamble, MD 02/17/2023, 7:22 AM

## 2023-02-18 DIAGNOSIS — F329 Major depressive disorder, single episode, unspecified: Secondary | ICD-10-CM

## 2023-02-18 MED ORDER — SERTRALINE HCL 100 MG PO TABS: 100.0000 mg | ORAL_TABLET | Freq: Every day | ORAL | 0 refills | Status: DC

## 2023-02-18 MED ORDER — BACITRACIN-NEOMYCIN-POLYMYXIN OINTMENT TUBE: 1.0000 | TOPICAL_OINTMENT | Freq: Two times a day (BID) | CUTANEOUS | 0 refills | Status: AC

## 2023-02-18 MED ORDER — BACITRACIN-NEOMYCIN-POLYMYXIN OINTMENT TUBE: 1.0000 | TOPICAL_OINTMENT | Freq: Two times a day (BID) | CUTANEOUS | 0 refills | Status: DC

## 2023-02-18 NOTE — [facility] Suicide Risk Assessment (Signed)
[facility] INPATIENT:  Family/Significant Other Suicide Prevention Education  Suicide Prevention Education:  Contact Attempts: [name] [phone] has been identified by the patient as the family member/significant other with whom the patient will be residing, and identified as the person(s) who will aid the patient in the event of a mental health crisis.  With written consent from the patient, two attempts were made to provide suicide prevention education, prior to and/or following the patient's discharge.  We were unsuccessful in providing suicide prevention education.  A suicide education pamphlet was given to the patient to share with family/significant other.  Date and time of first attempt:06/172024/114 pm Date and time of second attempt:02/17/2023/ 350 pm   Jenny Chan Jenny Chan 02/18/2023, 1:40 PM

## 2023-02-18 NOTE — Progress Notes (Signed)
   02/18/23 0601  15 Minute Checks  Location Bedroom  Visual Appearance Calm  Behavior Composed  Sleep (Behavioral Health Patients Only)  Calculate sleep? (Click Yes once per 24 hr at 0600 safety check) Yes  Documented sleep last 24 hours 6.75

## 2023-02-18 NOTE — Group Note (Signed)
Recreation Therapy Group Note   Group Topic:Other  Group Date: 02/18/2023 Start Time: 1403 End Time: 1445 Facilitators: Prudencio Velazco-McCall, LRT,CTRS Location: [address] Dayroom   Activity Description/Intervention: Therapeutic Drumming. Patients with peers and staff were given the opportunity to engage in a leader facilitated [facility] Group Empowerment Drumming Circle with staff from the [employer], in partnership with [employer]. Teaching laboratory technician and trained [employer], Theodoro Doing leading with LRT observing and documenting intervention and pt response. This evidenced-based practice targets 7 areas of health and wellbeing in the human experience including: stress-reduction, exercise, self-expression, camaraderie/support, nurturing, spirituality, and music-making (leisure).   Goal Area(s) Addresses:  Patient will engage in pro-social way in music group.  Patient will follow directions of drum leader on the first prompt. Patient will demonstrate no behavioral issues during group.  Patient will identify if a reduction in stress level occurs as a result of participation in therapeutic drum circle.    Education: Leisure exposure, Pharmacologist, Musical expression, Discharge Planning   Affect/Mood: Appropriate   Participation Level: Engaged   Participation Quality: Independent   Behavior: Appropriate   Speech/Thought Process: Focused   Insight: Good   Judgement: Good   Modes of Intervention: Teaching laboratory technician   Patient Response to Interventions:  Engaged   Education Outcome:  Acknowledges education   Clinical Observations/Individualized Feedback: Jenny Chan actively engaged in therapeutic drumming exercise and discussions. Pt was appropriate with peers, staff, and musical equipment for duration of programming. Pt affect congruent/incongruent with verbalized emotion.    Plan: Continue to engage patient in RT group sessions  2-3x/week.   Jenny Chan, LRT,CTRS 02/18/2023 3:52 PM

## 2023-02-18 NOTE — Progress Notes (Signed)
  [facility] Adult Case Management Discharge Plan :  Will you be returning to the same living situation after discharge:  Yes,  Mother Jenny Chan [phone] At discharge, do you have transportation home?: Yes,  Mother Jenny Chan [phone] Do you have the ability to pay for your medications: Yes,  Insured  Release of information consent forms completed and in the chart;  Patient's signature needed at discharge.  Patient to Follow up at:  Follow-up Information     [facility]. Go on 03/16/2023.   Specialty: Behavioral Health Why: You have an appointment for medication management services on 03/16/23 at 2:00 pm, in person.  You also have an appointment for therapy services on 04/14/23 at 1:00 pm.  The appointments will be held in person.  * Please give 24 hour notice of cancellation, as you can only use walk in services after 2 no shows, and can no longer schedule appts. Contact information: [phone] [place] [address] [phone]                Next level of care provider has access to [name] Link:yes  Safety Planning and Suicide Prevention discussed: No. Attempts made (see note)     Has patient been referred to the Quitline?: Patient does not use tobacco/nicotine products  Patient has been referred for addiction treatment: Yes, the patient will follow up with an outpatient provider for substance use disorder. Psychiatrist/APP: appointment made and Therapist: appointment made Patient to continue working towards treatment goals after discharge. Patient no longer meets criteria for inpatient criteria per attending physician. Continue taking medications as prescribed, nursing to provide instructions at discharge. Follow up with all scheduled appointments.   Jenny Wolf S Miakoda Mcmillion, LCSW 02/18/2023, 1:42 PM

## 2023-02-18 NOTE — Plan of Care (Signed)
  Problem: Education: Goal: Verbalization of understanding the information provided will improve Outcome: Progressing   Problem: Activity: Goal: Interest or engagement in activities will improve Outcome: Progressing   Problem: Health Behavior/Discharge Planning: Goal: Identification of resources available to assist in meeting health care needs will improve Outcome: Progressing   Problem: Physical Regulation: Goal: Ability to maintain clinical measurements within normal limits will improve Outcome: Progressing   Problem: Activity: Goal: Interest or engagement in leisure activities will improve Outcome: Progressing   Problem: Coping: Goal: Will verbalize feelings Outcome: Progressing

## 2023-02-18 NOTE — Progress Notes (Signed)
   02/18/23 0935  Psych Admission Type (Psych Patients Only)  Admission Status Involuntary  Psychosocial Assessment  Patient Complaints None  Eye Contact Fair  Facial Expression Flat  Affect Appropriate to circumstance  Speech Logical/coherent  Interaction Assertive  Motor Activity Other (Comment) (WDL)  Appearance/Hygiene Unremarkable  Behavior Characteristics Cooperative;Appropriate to situation  Mood Pleasant  Thought Process  Coherency WDL  Content WDL  Delusions None reported or observed  Perception WDL  Hallucination None reported or observed  Judgment Impaired  Confusion None  Danger to Self  Current suicidal ideation? Denies  Agreement Not to Harm Self Yes  Description of Agreement Pt verbally contracts for safety  Danger to Others  Danger to Others None reported or observed

## 2023-02-18 NOTE — Progress Notes (Signed)
   02/17/23 2200  Psych Admission Type (Psych Patients Only)  Admission Status Involuntary  Psychosocial Assessment  Patient Complaints None  Eye Contact Fair  Facial Expression Animated  Affect Appropriate to circumstance  Speech Logical/coherent  Interaction Assertive  Motor Activity Other (Comment) (WDL)  Appearance/Hygiene Unremarkable  Behavior Characteristics Cooperative;Appropriate to situation  Mood Pleasant  Thought Process  Coherency WDL  Content WDL  Delusions None reported or observed  Perception WDL  Hallucination None reported or observed  Judgment Impaired  Confusion None  Danger to Self  Current suicidal ideation? Denies  Agreement Not to Harm Self Yes  Description of Agreement Verbally contracts for safety.  Danger to Others  Danger to Others None reported or observed   Patient alert and oriented. Presenting appropriate to circumstance and pleasant. Patient denies SI, HI, AVH, and pain. Support and encouragement provided. Routine safety checks conducted every 15 minutes. Patient verbally contracts for safety and remains safe on the unit.

## 2023-02-18 NOTE — [facility] Group Notes (Signed)
[facility] Group Notes:  (Nursing/MHT/Case Management/Adjunct)  Date:  02/18/2023  Time:  11:49 AM  Type of Therapy:  Music Therapy  Participation Level:  Active  Participation Quality:  Appropriate  Affect:  Appropriate  Cognitive:  Appropriate  Insight:  Appropriate  Engagement in Group:  Engaged  Modes of Intervention:  Discussion  Summary of Progress/Problems:  Patient was asked to choose a song that was uplifting and positive and then tell why they picked that certain song. Patient attended and participated in music group.   Daneil Dan 02/18/2023, 11:49 AM

## 2023-02-18 NOTE — [facility] Group Notes (Signed)
[facility] Group Notes:  (Nursing/MHT/Case Management/Adjunct)  Date:  02/18/2023  Time:  9:16 AM  Type of Therapy:  Group Topic/ Focus: Goals Group: The focus of this group is to help patients establish daily goals to achieve during treatment and discuss how the patient can incorporate goal setting into their daily lives to aide in recovery.   Participation Level:  Active  Participation Quality:  Appropriate  Affect:  Appropriate  Cognitive:  Appropriate  Insight:  Appropriate  Engagement in Group:  Engaged  Modes of Intervention:  Discussion  Summary of Progress/Problems:  Patient attended and participated in goals/ orientation group today. Patient's goal for today is to have a good discharge.   Jenny Chan 02/18/2023, 9:16 AM

## 2023-02-18 NOTE — Progress Notes (Signed)
Pt denied SI/HI/AVH this morning. Pt rated her depression a 5/10, anxiety a 0/10, and feelings of hopelessness a 0/10. Pt has been pleasant, calm, and cooperative throughout the shift. RN provided support and encouragement to patient. Pt given scheduled medications as prescribed. Q15 min checks verified for safety. Patient verbally contracts for safety. Patient compliant with medications and treatment plan. Patient is interacting well on the unit. Pt is safe on the unit.   02/18/23 0935  Psych Admission Type (Psych Patients Only)  Admission Status Involuntary  Psychosocial Assessment  Patient Complaints None  Eye Contact Fair  Facial Expression Flat  Affect Appropriate to circumstance  Speech Logical/coherent  Interaction Assertive  Motor Activity Other (Comment) (WDL)  Appearance/Hygiene Unremarkable  Behavior Characteristics Cooperative;Appropriate to situation  Mood Pleasant  Thought Process  Coherency WDL  Content WDL  Delusions None reported or observed  Perception WDL  Hallucination None reported or observed  Judgment Impaired  Confusion None  Danger to Self  Current suicidal ideation? Denies  Agreement Not to Harm Self Yes  Description of Agreement Pt verbally contracts for safety  Danger to Others  Danger to Others None reported or observed

## 2023-02-18 NOTE — Discharge Instructions (Signed)
Dear Jenny Chan,  It was a pleasure to take care of you during your stay at [facility] where you were treated for your Current episode of major depressive disorder without prior episode.  While you were here, you were:  observed and cared for by our nurses and nursing assistants  treated with medications by your psychiatrists  provided individual and group therapy by therapists  provided resources by our social workers and case managers  Please review the medication list provided to you at discharge and stop, start taking, or continue taking the medications listed there.  You should also follow-up with your primary care doctor, or start seeing one if you don't have one yet. If applicable, here are some scheduled follow-ups for you:  Follow-up Information     [facility]. Go on 03/16/2023.   Specialty: Behavioral Health Why: You have an appointment for medication management services on 03/16/23 at 2:00 pm, in person.  You also have an appointment for therapy services on 04/14/23 at 1:00 pm.  The appointments will be held in person.  * Please give 24 hour notice of cancellation, as you can only use walk in services after 2 no shows, and can no longer schedule appts. Contact information: [address] [place] [address] [phone]                 I recommend abstinence from alcohol, tobacco, and other illicit drug use.   If your psychiatric symptoms or suicidal thoughts recur, worsen, or if you have side effects to your psychiatric medications, call your outpatient psychiatric provider, 911, 988 or go to the nearest emergency department.  Take care!  Signed: Augusto Gamble, MD 02/18/2023, 7:09 AM  Naloxone (Narcan) can help reverse an overdose when given to the victim quickly.  [place] offers free naloxone kits and instructions/training on its use.  Add naloxone to your first aid kit and you can help save a life. A  prescription can be filled at your local pharmacy or free kits are provided by the county.  Pick up your free kit at the following locations:   [place]:  [facility] Division of [facility], [address] [place] [address] [phone]) [facility] [address] [place] [id number] [phone]) [facility] [address] [place] [address]  [place]: [facility] Division of [facility] [address] [place] [address] ([phone]) [facility] [address] [place] [address] [phone])

## 2023-02-18 NOTE — BHH Suicide Risk Assessment (Signed)
[facility] Discharge Suicide Risk Assessment  Principal Problem: Current episode of major depressive disorder without prior episode Discharge Diagnoses: Principal Problem:   Current episode of major depressive disorder without prior episode Active Problems:   Severe episode of recurrent major depressive disorder, without psychotic features (HCC)   Social anxiety disorder   Reason for Admission:  Jenny Chan is a 27 y.o. woman with a past psychiatric history significant for bipolar disorder and no significant PMHx who presents to the [facility] Involuntary from [facility] Emergency Department for evaluation and management of suicidal ideations with overdose attempt on over 100 lamotrigine tablets and slitting bilateral wrists with razors   Hospital Summary During the patient's hospitalization, patient had extensive initial psychiatric evaluation, and follow-up psychiatric evaluations every day.   Psychiatric diagnoses provided upon initial assessment: Principal Problem:   Current episode of major depressive disorder without prior episode Active Problems:   Severe episode of recurrent major depressive disorder, without psychotic features (HCC)   Social anxiety disorder    The following medications were managed:   acetaminophen **AND** hydrOXYzine **AND** bismuth subsalicylate **AND** senna **AND** polyethylene glycol **AND** ondansetron **AND** alum & mag hydroxide-simeth, diphenhydrAMINE **OR** diphenhydrAMINE, haloperidol **OR** haloperidol lactate, LORazepam **OR** LORazepam, traZODone  neomycin-bacitracin-polymyxin   Topical BID   sertraline  100 mg Oral Daily    Jenny Chan is a 27 y.o. woman with a past psychiatric history significant for bipolar disorder and no significant PMHx who presents to the [facility] Involuntary from [facility] Emergency Department for evaluation and management of suicidal ideations with overdose attempt on over 100  lamotrigine tablets and slitting bilateral wrists with razors.    Gradually, patient started adjusting to milieu. The patient was evaluated each day by a clinical provider to ascertain response to treatment. Improvement was noted by the patient's report of decreasing symptoms, improved sleep and appetite, affect, medication tolerance, behavior, and participation in unit programming.  Patient was asked each day to complete a self inventory noting mood, mental status, pain, new symptoms, anxiety and concerns.     Symptoms were reported as significantly decreased or resolved completely by discharge.    On day of discharge, the patient reports that their mood is stable. The patient denied having suicidal thoughts for more than 48 hours prior to discharge.  Patient denies having homicidal thoughts.  Patient denies having auditory hallucinations.  Patient denies any visual hallucinations or other symptoms of psychosis. The patient was motivated to continue taking medication with a goal of continued improvement in mental health.    The patient reports their target psychiatric symptoms of depression and suicidal ideation responded well to the psychiatric medications, and the patient reports overall benefit other psychiatric hospitalization. Supportive psychotherapy was provided to the patient. The patient also participated in regular group therapy while hospitalized. Coping skills, problem solving as well as relaxation therapies were also part of the unit programming.   Labs were reviewed with the patient, and abnormal results were discussed with the patient.   The patient is able to verbalize their individual safety plan to this provider.   Behavioral Events: none   Restraints: none   # It is recommended to the patient to continue psychiatric medications as prescribed, after discharge from the hospital.     # It is recommended to the patient to follow up with your outpatient psychiatric provider and  PCP.   # It was discussed with the patient, the impact of alcohol, drugs, tobacco have been there overall  psychiatric and medical wellbeing, and total abstinence from substance use was recommended to the patient.   # Prescriptions provided or sent directly to preferred pharmacy at discharge. Patient agreeable to plan. Given opportunity to ask questions. Appears to feel comfortable with discharge.    # In the event of worsening symptoms, the patient is instructed to call the crisis hotline, 911 and or go to the nearest ED for appropriate evaluation and treatment of symptoms. To follow-up with primary care provider for other medical issues, concerns and or health care needs  Total Time spent with patient: 45 minutes  Psychiatric Specialty Exam: Presentation  General Appearance: Appropriate for Environment; Fairly Groomed   Eye Contact:Good   Speech:Clear and Coherent   Speech Volume:Normal   Handedness:No data recorded   Mood and Affect  Mood:Euthymic   Affect:Appropriate; Congruent; Full Range     Thought Process  Thought Processes:Coherent; Goal Directed; Linear   Descriptions of Associations:Intact   Orientation:Full (Time, Place and Person)   Thought Content:WDL   Hallucinations:Hallucinations: None   Ideas of Reference:None   Suicidal Thoughts:Suicidal Thoughts: No   Homicidal Thoughts:Homicidal Thoughts: No     Sensorium  Memory:Immediate Good; Recent Good; Remote Good   Judgment:Good   Insight:Good     Executive Functions  Concentration:Good   Attention Span:Good   Recall:Good   Fund of Knowledge:Good   Language:Good     Psychomotor Activity  Psychomotor Activity:Psychomotor Activity: Normal   Assets  Assets:Communication Skills; Desire for Improvement; Social Support   Sleep  Sleep:Sleep: Good     No data recorded   ROS and Physical Exam Review of Systems  Constitutional: Negative.   Respiratory: Negative.    Cardiovascular:  Negative.   Gastrointestinal: Negative.   Genitourinary: Negative.       Blood pressure (!) 100/58, pulse 96, temperature 97.7 F (36.5 C), temperature source Oral, resp. rate 16, SpO2 100 %, unknown if currently breastfeeding. There is no height or weight on file to calculate BMI. Physical Exam HENT:     Head: Normocephalic.  Pulmonary:     Effort: Pulmonary effort is normal.  Neurological:     General: No focal deficit present.     Mental Status: She is alert.   Mental Status Per Nursing Assessment::   On Admission:     Demographic Factors:  Adolescent or young adult  Loss Factors: NA  Historical Factors: Prior suicide attempts  Risk Reduction Factors:   Responsible for children under 62 years of age, Sense of responsibility to family, Living with another person, especially a relative, Positive social support, and Positive coping skills or problem solving skills  Continued Clinical Symptoms:  More than one psychiatric diagnosis  Cognitive Features That Contribute To Risk:  None    Suicide Risk:  Acute Risk: Mild: There are no identifiable suicide plans, no associated intent, mild dysphoria and related symptoms, good self-control (both objective and subjective assessment), few other risk factors, and identifiable protective factors, including available and accessible social support.  Chronic Risk: Mild: There are no identifiable suicide plans, no associated intent, mild dysphoria and related symptoms, good self-control (both objective and subjective assessment), few other risk factors, and identifiable protective factors, including available and accessible social support.   Follow-up Information     [facility]. Go on 03/16/2023.   Specialty: Behavioral Health Why: You have an appointment for medication management services on 03/16/23 at 2:00 pm, in person.  You also have an appointment for therapy services on 04/14/23  at 1:00 pm.  The  appointments will be held in person.  * Please give 24 hour notice of cancellation, as you can only use walk in services after 2 no shows, and can no longer schedule appts. Contact information: [address] [place] [address] [phone]                Plan Of Care/Follow-up recommendations:  Activity: as tolerated  Diet: heart healthy  Other: -Follow-up with your outpatient psychiatric provider -instructions on appointment date, time, and address (location) are provided to you in discharge paperwork.  -Take your psychiatric medications as prescribed at discharge - instructions are provided to you in the discharge paperwork  -Follow-up with outpatient primary care doctor and other specialists -for management of chronic medical disease, including: none  -Testing: Follow-up with outpatient provider for abnormal lab results: none  -Recommend abstinence from alcohol, tobacco, and other illicit drug use at discharge.   -If your psychiatric symptoms recur, worsen, or if you have side effects to your psychiatric medications, call your outpatient psychiatric provider, 911, 988 or go to the nearest emergency department.  -If suicidal thoughts recur, call your outpatient psychiatric provider, 911, 988 or go to the nearest emergency department.  Signed: Augusto Gamble, MD 02/18/2023, 9:25 AM

## 2023-02-18 NOTE — BHH Group Notes (Signed)
Spiritual care group facilitated by Chaplain Dyanne Carrel, [facility]  Group focused on topic of strength. Group members reflected on what thoughts and feelings emerge when they hear this topic. They then engaged in facilitated dialog around how strength is present in their lives. This dialog focused on representing what strength had been to them in their lives (images and patterns given) and what they saw as helpful in their life now (what they needed / wanted).  Activity drew on narrative framework.  Patient Progress: Pt attended group and engaged in group conversation and activities.

## 2023-02-18 NOTE — Discharge Summary (Signed)
Physician Discharge Summary Note Patient:  Jenny Chan is an 27 y.o., female MRN:  [id number] DOB:  Feb 23, 1996 Patient phone:  [phone] (home)  Patient address:   [address] [place] [facility] [address],  Total Time spent with patient: 45 minutes  Date of Admission:  02/13/2023 Date of Discharge: 02/18/2023  Subjective report: patient says she feels well. Denies any suicidal ideations  Reason for Admission:   Jenny Chan is a 27 y.o. woman with a past psychiatric history significant for bipolar disorder and no significant PMHx who presents to the  [facility] In[facility]untary from [facility] Emergency Department for evaluation and management of suicidal ideations with overdose attempt on over 100 lamotrigine tablets and slitting bilateral wrists with razors.   Principal Problem: Current episode of major depressive disorder without prior episode Discharge Diagnoses: Principal Problem:   Current episode of major depressive disorder without prior episode Active Problems:   Severe episode of recurrent major depressive disorder, without psychotic features (HCC)   Social anxiety disorder   Past Psychiatric History: Previous psych diagnoses:  endorses prior diagnosis of bipolar Prior inpatient psychiatric treatment:  admitted x2 .Once at age 58 for suicide attempt (cut wrists),  once again in 2022 for suicidal thoughts. Current/prior outpatient psychiatric treatment:  [facility] Current psychiatric provider:  patient does not know   Neuromodulation history: denies   Current therapist:  none, patient stopped going Psychotherapy hx:  previously seen at [facility]   History of suicide attempts:  see above History of homicide: Denies  Past Medical History:  Past Medical History:  Diagnosis Date   Seizure-like activity [facility])     Past Surgical History:  Procedure Laterality Date   NO PAST SURGERIES      Family History:  Medical: grandmother on  mom's side had diabetes Psych: unknown Psych rx: unknown Suicide: denies Homicide: denies Substance use family hx: denies   Social History:  Place of birth and grew up where: born in [place], grew up in [place] Abuse: no history of abuse Marital Status: single Sexual orientation: bisexual Children: 91 year old daughter Employment: unemployed, previously worked at [employer] 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  Psychiatric diagnoses provided upon initial assessment: Principal Problem:   Current episode of major depressive disorder without prior episode Active Problems:   Severe episode of recurrent major depressive disorder, without psychotic features (HCC)   Social anxiety disorder   The following medications were managed:  acetaminophen **AND** hydrOXYzine **AND** bismuth subsalicylate **AND** senna **AND** polyethylene glycol **AND** ondansetron **AND** alum & mag hydroxide-simeth, diphenhydrAMINE **OR** diphenhydrAMINE, haloperidol **OR** haloperidol lactate, LORazepam **OR** LORazepam, traZODone  neomycin-bacitracin-polymyxin   Topical BID   sertraline  100 mg Oral Daily   Jenny Chan is a 27 y.o. woman with a past psychiatric history significant for bipolar disorder and no significant PMHx who presents to the [facility] Involuntary from [facility] Emergency Department for evaluation and management of suicidal ideations with overdose attempt on over 100 lamotrigine tablets and slitting bilateral wrists with razors.   Gradually, patient started adjusting to  milieu. The patient was evaluated each day by a clinical provider to ascertain response to treatment. Improvement  was noted by the patient's report of decreasing symptoms, improved sleep and appetite, affect, medication tolerance, behavior, and participation in unit programming.  Patient was asked each day to complete a self inventory noting mood, mental status, pain, new symptoms, anxiety and concerns.    Symptoms were reported as significantly decreased or resolved completely by discharge.   On day of discharge, the patient reports that their mood is stable. The patient denied having suicidal thoughts for more than 48 hours prior to discharge.  Patient denies having homicidal thoughts.  Patient denies having auditory hallucinations.  Patient denies any visual hallucinations or other symptoms of psychosis. The patient was motivated to continue taking medication with a goal of continued improvement in mental health.   The patient reports their target psychiatric symptoms of depression and suicidal ideation responded well to the psychiatric medications, and the patient reports overall benefit other psychiatric hospitalization. Supportive psychotherapy was provided to the patient. The patient also participated in regular group therapy while hospitalized. Coping skills, problem solving as well as relaxation therapies were also part of the unit programming.  Labs were reviewed with the patient, and abnormal results were discussed with the patient.  The patient is able to verbalize their individual safety plan to this provider.  Behavioral Events: none  Restraints: none  # It is recommended to the patient to continue psychiatric medications as prescribed, after discharge from the hospital.    # It is recommended to the patient to follow up with your outpatient psychiatric provider and PCP.  # It was discussed with the patient, the impact of alcohol, drugs, tobacco have been there overall psychiatric  and medical wellbeing, and total abstinence from substance use was recommended to the patient.  # Prescriptions provided or sent directly to preferred pharmacy at discharge. Patient agreeable to plan. Given opportunity to ask questions. Appears to feel comfortable with discharge.    # In the event of worsening symptoms, the patient is instructed to call the crisis hotline, 911 and or go to the nearest ED for appropriate evaluation and treatment of symptoms. To follow-up with primary care provider for other medical issues, concerns and or health care needs  # Patient was discharged home with a plan to follow up as noted below.  Physical Findings: AIMS:  , ,  ,  ,    CIWA:    COWS:     Psychiatric Specialty Exam: Presentation  General Appearance: Appropriate for Environment; Fairly Groomed  Eye Contact:Good  Speech:Clear and Coherent  Speech Volume:Normal  Handedness:No data recorded  Mood and Affect  Mood:Euthymic  Affect:Appropriate; Congruent; Full Range   Thought Process  Thought Processes:Coherent; Goal Directed; Linear  Descriptions of Associations:Intact  Orientation:Full (Time, Place and Person)  Thought Content:WDL  Hallucinations:Hallucinations: None  Ideas of Reference:None  Suicidal Thoughts:Suicidal Thoughts: No  Homicidal Thoughts:Homicidal Thoughts: No   Sensorium  Memory:Immediate Good; Recent Good; Remote Good  Judgment:Good  Insight:Good   Executive Functions  Concentration:Good  Attention Span:Good  Recall:Good  Fund of Knowledge:Good  Language:Good   Psychomotor Activity  Psychomotor Activity:Psychomotor Activity: Normal  Assets  Assets:Communication Skills; Desire for Improvement; Social Support  Sleep  Sleep:Sleep: Good   No data recorded  ROS and Physical Exam Review of Systems  Constitutional: Negative.   Respiratory: Negative.    Cardiovascular: Negative.   Gastrointestinal: Negative.   Genitourinary:  Negative.     Blood pressure (!) 100/58, pulse 96, temperature 97.7 F (36.5 C), temperature source Oral, resp. rate 16, SpO2 100 %,  unknown if currently breastfeeding. There is no height or weight on file to calculate BMI. Physical Exam HENT:     Head: Normocephalic.  Pulmonary:     Effort: Pulmonary effort is normal.  Neurological:     General: No focal deficit present.     Mental Status: She is alert.     Assets  Assets:Communication Skills; Desire for Improvement; Social Support   Social History   Tobacco Use  Smoking Status Never  Smokeless Tobacco Never   Tobacco Cessation:  N/A, patient does not currently use tobacco products  Blood Alcohol level:  Lab Results  Component Value Date   ETH <5 10/05/2015    Metabolic Disorder Labs:  Lab Results  Component Value Date   HGBA1C 5.2 02/14/2023   MPG 102.54 02/14/2023   MPG 102.54 05/08/2022   No results found for: "PROLACTIN" Lab Results  Component Value Date   CHOL 151 02/14/2023   TRIG 46 02/14/2023   HDL 49 02/14/2023   CHOLHDL 3.1 02/14/2023   VLDL 9 02/14/2023   LDLCALC 93 02/14/2023   LDLCALC 128 (H) 05/08/2022    Discharge destination: home  Is patient on multiple antipsychotic therapies at discharge:  No   Has Patient had three or more failed trials of antipsychotic monotherapy by history:  No  Recommended Plan for Multiple Antipsychotic Therapies: NA   Allergies as of 02/18/2023       Reactions   Metronidazole Rash        Medication List     STOP taking these medications    acetaminophen 500 MG tablet Commonly known as: TYLENOL   ibuprofen 800 MG tablet Commonly known as: ADVIL   lamoTRIgine 100 MG tablet Commonly known as: LAMICTAL   multivitamin with minerals tablet       TAKE these medications      Indication  neomycin-bacitracin-polymyxin Oint Commonly known as: NEOSPORIN Apply 1 Application topically 2 (two) times daily.  Indication: wrist laceration    sertraline 100 MG tablet Commonly known as: ZOLOFT Take 1 tablet (100 mg total) by mouth daily.  Indication: Major Depressive Disorder        Follow-up Information     [facility]. Go on 03/16/2023.   Specialty: Behavioral Health Why: You have an appointment for medication management services on 03/16/23 at 2:00 pm, in person.  You also have an appointment for therapy services on 04/14/23 at 1:00 pm.  The appointments will be held in person.  * Please give 24 hour notice of cancellation, as you can only use walk in services after 2 no shows, and can no longer schedule appts. Contact information: [phone] [place] [address] [phone]                Discharge recommendations:  Activity: as tolerated  Diet: as tolerated  # It is recommended to the patient to continue psychiatric medications as prescribed, after discharge from the hospital.     # It is recommended to the patient to follow up with your outpatient psychiatric provider -instructions on appointment date, time, and address (location) are provided to you in discharge paperwork  # Follow-up with outpatient primary care doctor and other specialists -for management of chronic medical disease, including: none  # Testing: Follow-up with outpatient provider for abnormal lab results: none   # It was discussed with the patient, the impact of alcohol, drugs, tobacco have been there overall psychiatric and medical wellbeing, and total abstinence from substance  use was recommended to the patient.   # Prescriptions provided or sent directly to preferred pharmacy at discharge. Patient agreeable to plan. Given opportunity to ask questions. Appears to feel comfortable with discharge.    # In the event of worsening symptoms, the patient is instructed to call the crisis hotline, 911, and or go to the nearest ED for appropriate evaluation and treatment of symptoms. To follow-up with  primary care provider for other medical issues, concerns and or health care needs  Patient agrees with D/C instructions and plan.   I discussed my assessment, planned testing and intervention for the patient with Dr. Clovis Riley who agrees with my formulated course of action.  Signed: Augusto Gamble, MD 02/18/2023, 9:25 AM

## 2023-02-18 NOTE — Progress Notes (Signed)
Patient discharged from [facility] on 02/18/23 at 1645. Patient denies SI, plan, and intention. Suicide safety plan completed, reviewed with this RN, given to the patient, and a copy in the chart. Patient denies HI/AVH upon discharge. Patient rates her depression a 0/10 and her anxiety a 0/10. Patient is alert, oriented, and cooperative. RN provided patient with discharge paperwork and reviewed information with patient. Patient expressed that she understood all of the discharge instructions. Pt was satisfied with belongings returned to her from the locker and at bedside. Discharged patient to [facility] waiting room.

## 2023-02-28 ENCOUNTER — Ambulatory Visit (HOSPITAL_COMMUNITY)
Admission: EM | Admit: 2023-02-28 | Discharge: 2023-02-28 | Disposition: A | Discharge status: Home / Self Care | Payer: Medicaid Other | Attending: Emergency Medicine | Admitting: Emergency Medicine

## 2023-02-28 ENCOUNTER — Encounter (HOSPITAL_COMMUNITY): Payer: Self-pay

## 2023-02-28 DIAGNOSIS — S41111D Laceration without foreign body of right upper arm, subsequent encounter: Secondary | ICD-10-CM

## 2023-02-28 DIAGNOSIS — S41112D Laceration without foreign body of left upper arm, subsequent encounter: Secondary | ICD-10-CM | POA: Diagnosis not present

## 2023-02-28 DIAGNOSIS — Z4802 Encounter for removal of sutures: Secondary | ICD-10-CM

## 2023-02-28 DIAGNOSIS — Z5189 Encounter for other specified aftercare: Secondary | ICD-10-CM | POA: Diagnosis not present

## 2023-02-28 MED ORDER — MUPIROCIN 2 % EX OINT: 1.0000 | TOPICAL_OINTMENT | Freq: Two times a day (BID) | CUTANEOUS | 0 refills | Status: AC

## 2023-02-28 NOTE — ED Triage Notes (Signed)
Here for suture removal on bilateral arms.

## 2023-02-28 NOTE — Discharge Instructions (Addendum)
Apply the ointment twice daily Otherwise wash arms normally with soap and water  Please monitor for worsening symptoms and return with any concerns

## 2023-02-28 NOTE — ED Provider Notes (Signed)
[facility]    CSN: [id number] Arrival date & time: 02/28/23  1702      History   Chief Complaint Chief Complaint  Patient presents with   Suture / Staple Removal    HPI Falonda Mccluskey is a 27 y.o. female.  Here for suture removal and wound check Multiple lacerations to bilat arms, repaired in the ED Originally had 13 simple interrupted and 2 running Thinks one or two may have fallen out  A little yellow drainage from one cut this morning. Mildly tender. Denies swelling or redness No fever  Past Medical History:  Diagnosis Date   Seizure-like activity [facility])     Patient Active Problem List   Diagnosis Date Noted   Social anxiety disorder 02/14/2023   Current episode of major depressive disorder without prior episode 02/13/2023   History of seizures 12/31/2022   SVD (spontaneous vaginal delivery) 10/18/2018   Post term pregnancy at [redacted] weeks gestation 10/16/2018   Observed seizure-like activity (HCC) 07/26/2018   Severe episode of recurrent major depressive disorder, without psychotic features (HCC)     Past Surgical History:  Procedure Laterality Date   NO PAST SURGERIES      OB History     Gravida  1   Para  1   Term  1   Preterm      AB      Living  1      SAB      IAB      Ectopic      Multiple  0   Live Births  1            Home Medications    Prior to Admission medications   Medication Sig Start Date End Date Taking? Authorizing Provider  mupirocin ointment (BACTROBAN) 2 % Apply 1 Application topically 2 (two) times daily. 02/28/23  Yes Yechezkel Fertig, Lurena Joiner, PA-C  neomycin-bacitracin-polymyxin (NEOSPORIN) OINT Apply 1 Application topically 2 (two) times daily. 02/18/23 03/20/23 Yes Augusto Gamble, MD  sertraline (ZOLOFT) 100 MG tablet Take 1 tablet (100 mg total) by mouth daily. 02/18/23 03/20/23 Yes Augusto Gamble, MD    Family History Family History  Problem Relation Age of Onset   Healthy Mother    Healthy Father      Social History Social History   Tobacco Use   Smoking status: Never   Smokeless tobacco: Never  Vaping Use   Vaping Use: Never used  Substance Use Topics   Alcohol use: Yes   Drug use: Never     Allergies   Metronidazole   Review of Systems Review of Systems As per HPI  Physical Exam Triage Vital Signs ED Triage Vitals  Enc Vitals Group     BP 02/28/23 1743 103/69     Pulse Rate 02/28/23 1743 83     Resp 02/28/23 1743 18     Temp 02/28/23 1743 99.2 F (37.3 C)     Temp Source 02/28/23 1743 Oral     SpO2 02/28/23 1743 98 %     Weight --      Height --      Head Circumference --      Peak Flow --      Pain Score 02/28/23 1746 4     Pain Loc --      Pain Edu? --      Excl. in GC? --    No data found.  Updated Vital Signs BP 103/69 (BP Location: Left Arm)   Pulse  83   Temp 99.2 F (37.3 C) (Oral)   Resp 18   LMP 02/13/2023   SpO2 98%    Physical Exam Vitals and nursing note reviewed.  Constitutional:      General: She is not in acute distress. HENT:     Mouth/Throat:     Pharynx: Oropharynx is clear.  Cardiovascular:     Rate and Rhythm: Normal rate and regular rhythm.     Pulses: Normal pulses.  Pulmonary:     Effort: Pulmonary effort is normal.  Skin:    General: Skin is warm and dry.     Capillary Refill: Capillary refill takes less than 2 seconds.     Findings: Wound present.     Comments: Several wounds on bilat forearms. 11 simple interrupted sutures in place. 2 running locked  There is a scant amount of drainage from one lac on the right arm. No erythema, swelling, pus. Unclear if this is some serous drainage or related to the crusting around sutures  Neurological:     Mental Status: She is alert and oriented to person, place, and time.    13 total sutures visualized by provider   UC Treatments / Results  Labs (all labs ordered are listed, but only abnormal results are displayed) Labs Reviewed - No data to  display  EKG  Radiology No results found.  Procedures Procedures (including critical care time)  Medications Ordered in UC Medications - No data to display  Initial Impression / Assessment and Plan / UC Course  I have reviewed the triage vital signs and the nursing notes.  Pertinent labs & imaging results that were available during my care of the patient were reviewed by me and considered in my medical decision making (see chart for details).  Sutures removed by [facility] CMA Advised to wash arms normally with soap and water. Apply Bactroban ointment BID to the lac with scant drainage. Discussed signs of infection to monitor for. Patient understands return precautions. No questions at this time  Final Clinical Impressions(s) / UC Diagnoses   Final diagnoses:  Visit for suture removal  Visit for wound check  Laceration of multiple sites of right upper extremity, subsequent encounter  Laceration of multiple sites of left upper extremity, subsequent encounter     Discharge Instructions      Apply the ointment twice daily Otherwise wash arms normally with soap and water  Please monitor for worsening symptoms and return with any concerns     ED Prescriptions     Medication Sig Dispense Auth. Provider   mupirocin ointment (BACTROBAN) 2 % Apply 1 Application topically 2 (two) times daily. 15 g Jahmir Salo, Lurena Joiner, PA-C      PDMP not reviewed this encounter.   Georgios Kina, Ray Church 02/28/23 1610

## 2023-03-16 ENCOUNTER — Ambulatory Visit (HOSPITAL_COMMUNITY): Payer: Medicaid Other | Admitting: Psychiatry

## 2023-03-16 ENCOUNTER — Encounter (HOSPITAL_COMMUNITY): Payer: Self-pay

## 2023-03-16 ENCOUNTER — Encounter (HOSPITAL_COMMUNITY): Payer: Self-pay | Admitting: Psychiatry

## 2023-03-16 DIAGNOSIS — F4010 Social phobia, unspecified: Secondary | ICD-10-CM | POA: Diagnosis not present

## 2023-03-16 DIAGNOSIS — F319 Bipolar disorder, unspecified: Secondary | ICD-10-CM

## 2023-03-16 DIAGNOSIS — F321 Major depressive disorder, single episode, moderate: Secondary | ICD-10-CM | POA: Insufficient documentation

## 2023-03-16 MED ORDER — RISPERIDONE 0.5 MG PO TABS
0.5000 mg | ORAL_TABLET | Freq: Two times a day (BID) | ORAL | 3 refills | Status: DC
Start: 2023-03-16 — End: 2023-04-13

## 2023-03-16 MED ORDER — SERTRALINE HCL 100 MG PO TABS
100.0000 mg | ORAL_TABLET | Freq: Every day | ORAL | Status: DC
Start: 2023-03-16 — End: 2023-04-13

## 2023-03-16 NOTE — Progress Notes (Signed)
Psychiatric Initial Adult Assessment  Virtual Visit via Video Note  I connected with Jenny Chan on 03/16/23 at  2:00 PM EDT by a video enabled telemedicine application and verified that I am speaking with the correct person using two identifiers.  Location: Patient: Home Provider: Clinic   I discussed the limitations of evaluation and management by telemedicine and the availability of in person appointments. The patient expressed understanding and agreed to proceed.  I provided 45 minutes of non-face-to-face time during this encounter.    Patient Identification: Jenny Chan MRN:  [id number] Date of Evaluation:  03/16/2023 Referral Source: [facility] Chief Complaint:  "The Zoloft is not so great" Visit Diagnosis:    ICD-10-CM   1. Social anxiety disorder  F40.10 risperiDONE (RISPERDAL) 0.5 MG tablet    sertraline (ZOLOFT) 100 MG tablet    2. Moderate major depression (HCC)  F32.1 risperiDONE (RISPERDAL) 0.5 MG tablet    sertraline (ZOLOFT) 100 MG tablet      History of Present Illness: 27 year old female seen today for initial psychiatric evaluation.  She was referred to outpatient psychiatry by [facility] where she presented on 02/13/2023 through 6/19/202 for overdose on 100 Lamictal tablets and Slitting her risk.  She has a psychiatric history of social anxiety, depression, bipolar disorder, SA/SI.  She is currently managed on Zoloft 100 mg daily here she reports that her medication is ineffective in managing her psychiatric conditions.  Today patient logged in virtually however her camera  was turned off. During exam she is pleasant, cooperative, and engaged in conversation.  She informed Clinical research associate that Zoloft is not so great.  She notes that it gives her unwanted energy.  Patient notes that she is unable to sleep and endorses symptoms of hypomania such as racing thoughts, distractibility, irritability, and fluctuations in mood.  Since her hospitalization she informed writer that she  continues to be somewhat anxious and depressed. She reports that she worries anything about her future.  She also notes that she worries about her 74-year-old daughter.  Today provider conducted a GAD-7 and patient scored a 16. Provider also conducted a PHQ 9 and patient scored a 17. She notes her sleep and appetite is poor. She informed Clinical research associate that she has lost weight. Today she denies SI/HI/VAH or paranoia.   Today patient agreeable to start Rispedal 0.5 mg twice daily to help manage mood, anxiety, sleep, and appetite. At this time Zoloft not adjusted. Patient informed that Zoloft can be adjusted or discontinued if needed at her next visit. Potential side effects of medication and risks vs benefits of treatment vs non-treatment were explained and discussed. All questions were answered. She will follow up with counseling for therapy. No other concerns noted at this time.      Associated Signs/Symptoms: Depression Symptoms:  depressed mood, anhedonia, insomnia, psychomotor agitation, fatigue, feelings of worthlessness/guilt, difficulty concentrating, impaired memory, anxiety, (Hypo) Manic Symptoms:  Distractibility, Elevated Mood, Flight of Ideas, Irritable Mood, Anxiety Symptoms:  Excessive Worry, Psychotic Symptoms:   Denies PTSD Symptoms: NA  Past Psychiatric History: Depression, SI/SA, Social Anxiety  Previous Psychotropic Medications:  Trazodone, Lexapro, Hydroxyzine, Lamictal and Zoloft  Substance Abuse History in the last 12 months:  No.  Consequences of Substance Abuse: NA  Past Medical History:  Past Medical History:  Diagnosis Date   Seizure-like activity (HCC)     Past Surgical History:  Procedure Laterality Date   NO PAST SURGERIES      Family Psychiatric History: Brother unknown mental health conditions  Family History:  Family History  Problem Relation Age of Onset   Healthy Mother    Healthy Father     Social History:   Social History    Socioeconomic History   Marital status: Single    Spouse name: Not on file   Number of children: 0   Years of education: 12   Highest education level: High school graduate  Occupational History   Occupation: unemployed  Tobacco Use   Smoking status: Never   Smokeless tobacco: Never  Vaping Use   Vaping status: Never Used  Substance and Sexual Activity   Alcohol use: Yes   Drug use: Never   Sexual activity: Yes    Birth control/protection: None  Other Topics Concern   Not on file  Social History Narrative   Right-handed.   Occasional caffeine use.   Lives at home with her mother.   Social Determinants of Health   Financial Resource Strain: Not on file  Food Insecurity: Not on file  Transportation Needs: Not on file  Physical Activity: Not on file  Stress: Not on file  Social Connections: Not on file    Additional Social History: Patient resides in [place] with her mother. She is single and has one 17 year old daughter. She denies alcohol, tobacco, or illegal drug use.   Allergies:   Allergies  Allergen Reactions   Metronidazole Rash    Metabolic Disorder Labs: Lab Results  Component Value Date   HGBA1C 5.2 02/14/2023   MPG 102.54 02/14/2023   MPG 102.54 05/08/2022   No results found for: "PROLACTIN" Lab Results  Component Value Date   CHOL 151 02/14/2023   TRIG 46 02/14/2023   HDL 49 02/14/2023   CHOLHDL 3.1 02/14/2023   VLDL 9 02/14/2023   LDLCALC 93 02/14/2023   LDLCALC 128 (H) 05/08/2022   Lab Results  Component Value Date   TSH 1.398 02/14/2023    Therapeutic Level Labs: No results found for: "LITHIUM" No results found for: "CBMZ" No results found for: "VALPROATE"  Current Medications: Current Outpatient Medications  Medication Sig Dispense Refill   risperiDONE (RISPERDAL) 0.5 MG tablet Take 1 tablet (0.5 mg total) by mouth 2 (two) times daily. 60 tablet 3   mupirocin ointment (BACTROBAN) 2 % Apply 1 Application topically 2 (two)  times daily. 15 g 0   neomycin-bacitracin-polymyxin (NEOSPORIN) OINT Apply 1 Application topically 2 (two) times daily. 60 g 0   sertraline (ZOLOFT) 100 MG tablet Take 1 tablet (100 mg total) by mouth daily.     No current facility-administered medications for this visit.    Musculoskeletal: Strength & Muscle Tone:  Unable to assess, patients camera off Gait & Station:  Unable to assess, patients camera off Patient leans: N/A  Psychiatric Specialty Exam: Review of Systems  Last menstrual period 02/13/2023, unknown if currently breastfeeding.There is no height or weight on file to calculate BMI.  General Appearance:  Unable to assess, patients camera off  Eye Contact:   Unable to assess, patients camera off  Speech:  Clear and Coherent and Normal Rate  Volume:  Normal  Mood:  Anxious and Depressed  Affect:  Appropriate and Congruent  Thought Process:  Coherent, Goal Directed, and Linear  Orientation:  Full (Time, Place, and Person)  Thought Content:  WDL and Logical  Suicidal Thoughts:  No  Homicidal Thoughts:  No  Memory:  Immediate;   Good Recent;   Good Remote;   Good  Judgement:  Good  Insight:  Good  Psychomotor  Activity:  Normal  Concentration:  Concentration: Good and Attention Span: Good  Recall:  Good  Fund of Knowledge:Good  Language: Good  Akathisia:  No  Handed:  Right  AIMS (if indicated):  not done  Assets:  Communication Skills Desire for Improvement Financial Resources/Insurance Housing Physical Health Social Support  ADL's:  Intact  Cognition: WNL  Sleep:  Poor   Screenings: AIMS    Flowsheet Row [facility]  AIMS Total Score 0      GAD-7    Flowsheet Row Office Visit from 03/16/2023 in [facility] Office Visit from 12/31/2022 in [facility]  Total GAD-7 Score 16 11      PHQ2-9    Flowsheet Row Office Visit from  03/16/2023 in [facility] Office Visit from 12/31/2022 in [facility] ED from 05/08/2022 in [facility]  PHQ-2 Total Score 4 0 5  PHQ-9 Total Score 17 10 16       Flowsheet Row Office Visit from 03/16/2023 in [facility] ED from 02/28/2023 in [facility] ED from 02/12/2023 in [facility] Emergency Department [facility]  C-SSRS RISK CATEGORY Error: Q3, 4, or 5 should not be populated when Q2 is No Error: Q3, 4, or 5 should not be populated when Q2 is No High Risk       Assessment and Plan: Patient endorse increased anxiety, depression, and symptoms of hypomania. Today patient agreeable to start Rispedal 0.5 mg twice daily to help manage mood, anxiety, sleep, and appetite. At this time Zoloft not adjusted. Patient informed that Zoloft can be adjusted or discontinued if needed at her next visit.   1. Social anxiety disorder  Start- risperiDONE (RISPERDAL) 0.5 MG tablet; Take 1 tablet (0.5 mg total) by mouth 2 (two) times daily.  Dispense: 60 tablet; Refill: 3 Continue- sertraline (ZOLOFT) 100 MG tablet; Take 1 tablet (100 mg total) by mouth daily.  2. Bipolar I disorder (HCC)  Continue - risperiDONE (RISPERDAL) 0.5 MG tablet; Take 1 tablet (0.5 mg total) by mouth 2 (two) times daily.  Dispense: 60 tablet; Refill: 3   Collaboration of Care: Other provider involved in patient's care AEB PCP  Patient/Guardian was advised Release of Information must be obtained prior to any record release in order to collaborate their care with an outside provider. Patient/Guardian was advised if they have not already done so to contact the registration department to sign all necessary forms in order for [place] to release information regarding their care.   Consent: Patient/Guardian gives verbal consent for treatment and assignment of benefits for services provided during this  visit. Patient/Guardian expressed understanding and agreed to proceed.   Follow up in 3 months Follow up with therapy  Shanna Cisco, NP 7/15/20242:42 PM

## 2023-03-17 ENCOUNTER — Telehealth (HOSPITAL_COMMUNITY): Payer: Self-pay

## 2023-04-13 ENCOUNTER — Ambulatory Visit: Payer: Medicaid Other | Admitting: Neurology

## 2023-04-13 ENCOUNTER — Other Ambulatory Visit (HOSPITAL_COMMUNITY): Payer: Self-pay | Admitting: Psychiatry

## 2023-04-13 ENCOUNTER — Telehealth (HOSPITAL_COMMUNITY): Payer: Self-pay | Admitting: Psychiatry

## 2023-04-13 DIAGNOSIS — F4010 Social phobia, unspecified: Secondary | ICD-10-CM

## 2023-04-13 DIAGNOSIS — F319 Bipolar disorder, unspecified: Secondary | ICD-10-CM

## 2023-04-13 MED ORDER — RISPERIDONE 0.5 MG PO TABS: 0.5000 mg | ORAL_TABLET | Freq: Two times a day (BID) | ORAL | 3 refills | Status: DC

## 2023-04-13 MED ORDER — SERTRALINE HCL 100 MG PO TABS
100.0000 mg | ORAL_TABLET | Freq: Every day | ORAL | 3 refills | Status: DC
Start: 2023-04-13 — End: 2023-05-22

## 2023-04-13 NOTE — Telephone Encounter (Signed)
Patient is out of her sertraline medication that was prescribed to her at the other place she was at before her 7/15th appt with you. She states that she hasn't started the 30 doses you prescribed her yet and the pharmacy wont let her pick it up.

## 2023-04-13 NOTE — Telephone Encounter (Signed)
Risperdal and Zoloft sent to preferred pharmacy.

## 2023-04-14 ENCOUNTER — Ambulatory Visit (HOSPITAL_COMMUNITY): Payer: Medicaid Other | Admitting: Clinical

## 2023-05-13 ENCOUNTER — Encounter (HOSPITAL_COMMUNITY): Payer: Self-pay

## 2023-05-13 ENCOUNTER — Telehealth (HOSPITAL_COMMUNITY): Payer: Medicaid Other | Admitting: Psychiatry

## 2023-05-22 ENCOUNTER — Encounter (HOSPITAL_COMMUNITY): Payer: Self-pay | Admitting: Psychiatry

## 2023-05-22 ENCOUNTER — Telehealth (INDEPENDENT_AMBULATORY_CARE_PROVIDER_SITE_OTHER): Payer: Medicaid Other | Admitting: Psychiatry

## 2023-05-22 DIAGNOSIS — F319 Bipolar disorder, unspecified: Secondary | ICD-10-CM | POA: Diagnosis not present

## 2023-05-22 DIAGNOSIS — F4010 Social phobia, unspecified: Secondary | ICD-10-CM

## 2023-05-22 MED ORDER — RISPERIDONE 0.5 MG PO TABS
0.5000 mg | ORAL_TABLET | Freq: Two times a day (BID) | ORAL | 3 refills | Status: DC
Start: 2023-05-22 — End: 2023-07-23

## 2023-05-22 MED ORDER — SERTRALINE HCL 100 MG PO TABS
100.0000 mg | ORAL_TABLET | Freq: Every day | ORAL | 3 refills | Status: DC
Start: 2023-05-22 — End: 2023-07-23

## 2023-05-22 NOTE — Progress Notes (Signed)
BH MD/PA/NP OP Progress Note Virtual Visit via Video Note  I connected with Jenny Chan on 05/22/23 at  9:30 AM EDT by a video enabled telemedicine application and verified that I am speaking with the correct person using two identifiers.  Location: Patient: Home Provider: Clinic   I discussed the limitations of evaluation and management by telemedicine and the availability of in person appointments. The patient expressed understanding and agreed to proceed.  I provided 30 minutes of non-face-to-face time during this encounter.   05/22/2023 10:00 AM Chatoya Stenger  MRN:  [id number]  Chief Complaint: "The medications are working"  HPI:  27 year old female seen today for follow-up psychiatric evaluation.  She has a psychiatric history of social anxiety, depression, bipolar disorder, SA/SI.  She is currently managed on Zoloft 100 mg daily and Risperdal 0.5 mg twice daily.  She notes that her medications are effective in managing her psychiatric conditions.     Today she was well-groomed, pleasant, cooperative, and engaged in conversation.  She informed Clinical research associate that her medications are working.  She notes that her mood is more stable and denies symptoms of mania today.  Patient reports that she does have some anxiety about finances.  She is currently unemployed and is caring for her 97-year-old daughter.  She notes that she is able to cope with this.  Follow-up today provider conducted a GAD-7 and patient scored a 12, at her last visit she scored a 16.  Provider also conducted PHQ-9 and patient scored a 7, at her last visit she scored a 17.  She endorsed adequate sleep and appetite.  Today she denies SI/HI/VAH, mania, paranoia.   No medication changes made.  Patient agreeable to continue medication as prescribed. She will follow up with counseling for therapy. No other concerns noted at this time. Visit Diagnosis:    ICD-10-CM   1. Social anxiety disorder  F40.10 sertraline (ZOLOFT) 100 MG  tablet    risperiDONE (RISPERDAL) 0.5 MG tablet    2. Bipolar I disorder (HCC)  F31.9 risperiDONE (RISPERDAL) 0.5 MG tablet      Past Psychiatric History: Depression, SI/SA, Social Anxiety   Past Medical History:  Past Medical History:  Diagnosis Date   Seizure-like activity (HCC)     Past Surgical History:  Procedure Laterality Date   NO PAST SURGERIES      Family Psychiatric History: Brother unknown mental health conditions   Family History:  Family History  Problem Relation Age of Onset   Healthy Mother    Healthy Father     Social History:  Social History   Socioeconomic History   Marital status: Single    Spouse name: Not on file   Number of children: 0   Years of education: 12   Highest education level: High school graduate  Occupational History   Occupation: unemployed  Tobacco Use   Smoking status: Never   Smokeless tobacco: Never  Vaping Use   Vaping status: Never Used  Substance and Sexual Activity   Alcohol use: Yes   Drug use: Never   Sexual activity: Yes    Birth control/protection: None  Other Topics Concern   Not on file  Social History Narrative   Right-handed.   Occasional caffeine use.   Lives at home with her mother.   Social Determinants of Health   Financial Resource Strain: Not on file  Food Insecurity: Not on file  Transportation Needs: Not on file  Physical Activity: Not on file  Stress: Not on file  Social Connections: Not on file    Allergies:  Allergies  Allergen Reactions   Metronidazole Rash    Metabolic Disorder Labs: Lab Results  Component Value Date   HGBA1C 5.2 02/14/2023   MPG 102.54 02/14/2023   MPG 102.54 05/08/2022   No results found for: "PROLACTIN" Lab Results  Component Value Date   CHOL 151 02/14/2023   TRIG 46 02/14/2023   HDL 49 02/14/2023   CHOLHDL 3.1 02/14/2023   VLDL 9 02/14/2023   LDLCALC 93 02/14/2023   LDLCALC 128 (H) 05/08/2022   Lab Results  Component Value Date   TSH 1.398  02/14/2023   TSH 1.361 05/08/2022    Therapeutic Level Labs: No results found for: "LITHIUM" No results found for: "VALPROATE" No results found for: "CBMZ"  Current Medications: Current Outpatient Medications  Medication Sig Dispense Refill   mupirocin ointment (BACTROBAN) 2 % Apply 1 Application topically 2 (two) times daily. 15 g 0   risperiDONE (RISPERDAL) 0.5 MG tablet Take 1 tablet (0.5 mg total) by mouth 2 (two) times daily. 60 tablet 3   sertraline (ZOLOFT) 100 MG tablet Take 1 tablet (100 mg total) by mouth daily. 30 tablet 3   No current facility-administered medications for this visit.     Musculoskeletal: Strength & Muscle Tone: within normal limits and telehealth visit Gait & Station: normal, telehealth visit Patient leans: N/A  Psychiatric Specialty Exam: Review of Systems  unknown if currently breastfeeding.There is no height or weight on file to calculate BMI.  General Appearance: Well Groomed  Eye Contact:  Good  Speech:  Clear and Coherent and Normal Rate  Volume:  Normal  Mood:  Euthymic  Affect:  Appropriate  Thought Process:  Coherent, Goal Directed, and Linear  Orientation:  Full (Time, Place, and Person)  Thought Content: WDL and Logical   Suicidal Thoughts:  No  Homicidal Thoughts:  No  Memory:  Immediate;   Good Recent;   Good Remote;   Good  Judgement:  Good  Insight:  Good  Psychomotor Activity:  Normal  Concentration:  Concentration: Good and Attention Span: Good  Recall:  Good  Fund of Knowledge: Good  Language: Good  Akathisia:  No  Handed:  Right  AIMS (if indicated): not done  Assets:  Communication Skills Desire for Improvement Financial Resources/Insurance Leisure Time Physical Health Social Support  ADL's:  Intact  Cognition: WNL  Sleep:  Good   Screenings: AIMS    Flowsheet Row [facility]  AIMS Total Score 0      GAD-7    Flowsheet Row  Video Visit from 05/22/2023 in [facility] Office Visit from 03/16/2023 in [facility] Office Visit from 12/31/2022 in [facility]  Total GAD-7 Score 12 16 11       PHQ2-9    Flowsheet Row Video Visit from 05/22/2023 in [facility] from 12/31/2022 in [facility] ED from 05/08/2022 in [facility]  PHQ-2 Total Score 2 4 0 5  PHQ-9 Total Score 7 17 10 16       Flowsheet Row Office Visit from 03/16/2023 in [facility] ED from 02/28/2023 in [facility] ED from 02/12/2023 in [facility] Department at [facility]  C-SSRS RISK CATEGORY Error: Q3, 4,  or 5 should not be populated when Q2 is No Error: Q3, 4, or 5 should not be populated when Q2 is No High Risk        Assessment and Plan: Patient reports that she is doing well on her current medication regimen.  She does note that she has some anxiety about finances but is able to cope with it.  No medication changes made.  Patient agreeable to continue medication as prescribed.  1. Social anxiety disorder  Continue- sertraline (ZOLOFT) 100 MG tablet; Take 1 tablet (100 mg total) by mouth daily.  Dispense: 30 tablet; Refill: 3 Continue- risperiDONE (RISPERDAL) 0.5 MG tablet; Take 1 tablet (0.5 mg total) by mouth 2 (two) times daily.  Dispense: 60 tablet; Refill: 3  2. Bipolar I disorder (HCC)  Continue- risperiDONE (RISPERDAL) 0.5 MG tablet; Take 1 tablet (0.5 mg total) by mouth 2 (two) times daily.  Dispense: 60 tablet; Refill: 3  Collaboration of Care: Collaboration of Care: Other provider involved in patient's care AEB PCP  Patient/Guardian was advised Release of Information must be obtained prior to any record release in order to collaborate  their care with an outside provider. Patient/Guardian was advised if they have not already done so to contact the registration department to sign all necessary forms in order for [place] to release information regarding their care.   Consent: Patient/Guardian gives verbal consent for treatment and assignment of benefits for services provided during this visit. Patient/Guardian expressed understanding and agreed to proceed.   Follow-up in 2 months   Shanna Cisco, NP 05/22/2023, 10:00 AM

## 2023-07-23 ENCOUNTER — Encounter (HOSPITAL_COMMUNITY): Payer: Self-pay | Admitting: Psychiatry

## 2023-07-23 ENCOUNTER — Telehealth (HOSPITAL_COMMUNITY): Payer: MEDICAID | Admitting: Psychiatry

## 2023-07-23 DIAGNOSIS — F4010 Social phobia, unspecified: Secondary | ICD-10-CM | POA: Diagnosis not present

## 2023-07-23 DIAGNOSIS — F319 Bipolar disorder, unspecified: Secondary | ICD-10-CM | POA: Diagnosis not present

## 2023-07-23 MED ORDER — SERTRALINE HCL 100 MG PO TABS
100.0000 mg | ORAL_TABLET | Freq: Every day | ORAL | 3 refills | Status: DC
Start: 2023-07-23 — End: 2023-10-01

## 2023-07-23 MED ORDER — RISPERIDONE 0.5 MG PO TABS
0.5000 mg | ORAL_TABLET | Freq: Two times a day (BID) | ORAL | 3 refills | Status: DC
Start: 2023-07-23 — End: 2023-10-01

## 2023-07-23 NOTE — Progress Notes (Signed)
BH MD/PA/NP OP Progress Note Virtual Visit via Video Note  I connected with Jenny Chan on 07/23/23 at  4:00 PM EST by a video enabled telemedicine application and verified that I am speaking with the correct person using two identifiers.  Location: Patient: Home Provider: Clinic   I discussed the limitations of evaluation and management by telemedicine and the availability of in person appointments. The patient expressed understanding and agreed to proceed.  I provided 30 minutes of non-face-to-face time during this encounter.   07/23/2023 4:12 PM Jenny Chan  MRN:  [id number]  Chief Complaint: "I am doing okay"  HPI:  27 year old female seen today for follow-up psychiatric evaluation.  She has a psychiatric history of social anxiety, depression, bipolar disorder, SA/SI.  She is currently managed on Zoloft 100 mg daily and Risperdal 0.5 mg twice daily.  She notes that her medications are effective in managing her psychiatric conditions.     Today she was well-groomed, pleasant, cooperative, and engaged in conversation.  She informed Clinical research associate that her she is doing okay.  She notes that not much is changed since her last visit.  She reports that her mood continues to be stable and notes that she has minimal anxiety and depression. Today provider conducted a GAD-7 and patient scored a , at her last visit she scored a 12.  Provider also conducted PHQ-9 and patient scored a 2, at her last visit she scored a 7.  She endorsed adequate sleep and appetite.  Today she denies SI/HI/VAH, mania, paranoia.   No medication changes made.  Patient agreeable to continue medication as prescribed. She will follow up with out patient counseling for therapy. No other concerns noted at this time. Visit Diagnosis:    ICD-10-CM   1. Social anxiety disorder  F40.10 risperiDONE (RISPERDAL) 0.5 MG tablet    sertraline (ZOLOFT) 100 MG tablet    2. Bipolar I disorder (HCC)  F31.9 risperiDONE (RISPERDAL) 0.5 MG  tablet       Past Psychiatric History: Depression, SI/SA, Social Anxiety   Past Medical History:  Past Medical History:  Diagnosis Date   Seizure-like activity (HCC)     Past Surgical History:  Procedure Laterality Date   NO PAST SURGERIES      Family Psychiatric History: Brother unknown mental health conditions   Family History:  Family History  Problem Relation Age of Onset   Healthy Mother    Healthy Father     Social History:  Social History   Socioeconomic History   Marital status: Single    Spouse name: Not on file   Number of children: 0   Years of education: 12   Highest education level: High school graduate  Occupational History   Occupation: unemployed  Tobacco Use   Smoking status: Never   Smokeless tobacco: Never  Vaping Use   Vaping status: Never Used  Substance and Sexual Activity   Alcohol use: Yes   Drug use: Never   Sexual activity: Yes    Birth control/protection: None  Other Topics Concern   Not on file  Social History Narrative   Right-handed.   Occasional caffeine use.   Lives at home with her mother.   Social Determinants of Health   Financial Resource Strain: Not on file  Food Insecurity: Not on file  Transportation Needs: Not on file  Physical Activity: Not on file  Stress: Not on file  Social Connections: Not on file    Allergies:  Allergies  Allergen Reactions  Metronidazole Rash    Metabolic Disorder Labs: Lab Results  Component Value Date   HGBA1C 5.2 02/14/2023   MPG 102.54 02/14/2023   MPG 102.54 05/08/2022   No results found for: "PROLACTIN" Lab Results  Component Value Date   CHOL 151 02/14/2023   TRIG 46 02/14/2023   HDL 49 02/14/2023   CHOLHDL 3.1 02/14/2023   VLDL 9 02/14/2023   LDLCALC 93 02/14/2023   LDLCALC 128 (H) 05/08/2022   Lab Results  Component Value Date   TSH 1.398 02/14/2023   TSH 1.361 05/08/2022    Therapeutic Level Labs: No results found for: "LITHIUM" No results found  for: "VALPROATE" No results found for: "CBMZ"  Current Medications: Current Outpatient Medications  Medication Sig Dispense Refill   mupirocin ointment (BACTROBAN) 2 % Apply 1 Application topically 2 (two) times daily. 15 g 0   risperiDONE (RISPERDAL) 0.5 MG tablet Take 1 tablet (0.5 mg total) by mouth 2 (two) times daily. 60 tablet 3   sertraline (ZOLOFT) 100 MG tablet Take 1 tablet (100 mg total) by mouth daily. 30 tablet 3   No current facility-administered medications for this visit.     Musculoskeletal: Strength & Muscle Tone: within normal limits and telehealth visit Gait & Station: normal, telehealth visit Patient leans: N/A  Psychiatric Specialty Exam: Review of Systems  unknown if currently breastfeeding.There is no height or weight on file to calculate BMI.  General Appearance: Well Groomed  Eye Contact:  Good  Speech:  Clear and Coherent and Normal Rate  Volume:  Normal  Mood:  Euthymic  Affect:  Appropriate  Thought Process:  Coherent, Goal Directed, and Linear  Orientation:  Full (Time, Place, and Person)  Thought Content: WDL and Logical   Suicidal Thoughts:  No  Homicidal Thoughts:  No  Memory:  Immediate;   Good Recent;   Good Remote;   Good  Judgement:  Good  Insight:  Good  Psychomotor Activity:  Normal  Concentration:  Concentration: Good and Attention Span: Good  Recall:  Good  Fund of Knowledge: Good  Language: Good  Akathisia:  No  Handed:  Right  AIMS (if indicated): not done  Assets:  Communication Skills Desire for Improvement Financial Resources/Insurance Leisure Time Physical Health Social Support  ADL's:  Intact  Cognition: WNL  Sleep:  Good   Screenings: AIMS    Flowsheet Row [facility]  AIMS Total Score 0      GAD-7    Flowsheet Row Video Visit from 07/23/2023 in [facility] Video Visit from 05/22/2023 in [facility] Office Visit from 03/16/2023 in [facility] Office Visit from 12/31/2022 in [facility]  Total GAD-7 Score 6 12 16 11       PHQ2-9    Flowsheet Row Video Visit from 07/23/2023 in [facility] Video Visit from 05/22/2023 in [facility] Office Visit from 03/16/2023 in [facility] Office Visit from 12/31/2022 in [facility] Of [facility] ED from 05/08/2022 in [facility]  PHQ-2 Total Score 0 2 4 0 5  PHQ-9 Total Score 2 7 17 10 16       Flowsheet Row Office Visit from 03/16/2023 in [facility] ED from 02/28/2023 in  [facility] ED from 02/12/2023 in [facility] Emergency Department at [facility]  C-SSRS RISK CATEGORY Error: Q3, 4, or 5 should not be populated when Q2 is No Error: Q3, 4, or 5 should not be populated when Q2 is No High Risk        Assessment and Plan: Patient reports that she is doing well on her current medication regimen. No medication changes made.  Patient agreeable to continue medication as prescribed.  1. Social anxiety disorder  Continue- sertraline (ZOLOFT) 100 MG tablet; Take 1 tablet (100 mg total) by mouth daily.  Dispense: 30 tablet; Refill: 3 Continue- risperiDONE (RISPERDAL) 0.5 MG tablet; Take 1 tablet (0.5 mg total) by mouth 2 (two) times daily.  Dispense: 60 tablet; Refill: 3  2. Bipolar I disorder (HCC)  Continue- risperiDONE (RISPERDAL) 0.5 MG tablet; Take 1 tablet (0.5 mg total) by mouth 2 (two) times daily.  Dispense: 60 tablet; Refill: 3  Collaboration of Care: Collaboration of Care: Other provider involved in patient's care AEB PCP  Patient/Guardian was advised Release of Information must be obtained prior to any record  release in order to collaborate their care with an outside provider. Patient/Guardian was advised if they have not already done so to contact the registration department to sign all necessary forms in order for [place] to release information regarding their care.   Consent: Patient/Guardian gives verbal consent for treatment and assignment of benefits for services provided during this visit. Patient/Guardian expressed understanding and agreed to proceed.   Follow-up in 2.5 months   Shanna Cisco, NP 07/23/2023, 4:12 PM

## 2023-10-01 ENCOUNTER — Encounter (HOSPITAL_COMMUNITY): Payer: Self-pay | Admitting: Psychiatry

## 2023-10-01 ENCOUNTER — Telehealth (HOSPITAL_COMMUNITY): Payer: MEDICAID | Admitting: Psychiatry

## 2023-10-01 DIAGNOSIS — F4010 Social phobia, unspecified: Secondary | ICD-10-CM | POA: Diagnosis not present

## 2023-10-01 DIAGNOSIS — F319 Bipolar disorder, unspecified: Secondary | ICD-10-CM | POA: Diagnosis not present

## 2023-10-01 MED ORDER — SERTRALINE HCL 100 MG PO TABS
100.0000 mg | ORAL_TABLET | Freq: Every day | ORAL | 3 refills | Status: DC
Start: 2023-10-01 — End: 2023-12-16

## 2023-10-01 MED ORDER — RISPERIDONE 0.5 MG PO TABS: 0.5000 mg | ORAL_TABLET | Freq: Two times a day (BID) | ORAL | 3 refills | Status: DC

## 2023-10-01 NOTE — Progress Notes (Signed)
BH MD/PA/NP OP Progress Note Virtual Visit via Video Note  I connected with Jenny Chan on 10/01/23 at  4:00 PM EST by a video enabled telemedicine application and verified that I am speaking with the correct person using two identifiers.  Location: Patient: Work Provider: Clinic   I discussed the limitations of evaluation and management by telemedicine and the availability of in person appointments. The patient expressed understanding and agreed to proceed.  I provided 30 minutes of non-face-to-face time during this encounter.   10/01/2023 4:14 PM Jenny Chan  MRN:  [id number]  Chief Complaint: "I am doing okay"  HPI:  28 year old female seen today for follow-up psychiatric evaluation.  She has a psychiatric history of social anxiety, depression, bipolar disorder, SA/SI.  She is currently managed on Zoloft 100 mg daily and Risperdal 0.5 mg twice daily.  She notes that her medications are effective in managing her psychiatric conditions.     Today she was well-groomed, pleasant, cooperative, and engaged in conversation.  She informed Clinical research associate that her she is doing okay.  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  Since her last visit she notes that she continues to have minimal anxiety and depression and notes that her mood is stable.  Today provider conducted a GAD-7 and patient scored a 4, at her last visit she scored a 6.  Provider also conducted PHQ-9 and patient scored a 6, at her last visit she scored a 2.  She endorsed adequate sleep and appetite.  Today she denies SI/HI/VAH, mania, paranoia.  Provider asked patient if she was experiencing any abnormal muscle movements.  She notes that she feels that her eyes and hands twitches abnormally at times.  Provider informed patient that this would need to be assessed in person.  She informed Clinical research associate that she will inform her if it worsens and, and if needed for an assessment.  Provider informed  patient that Risperdal could be the culprit.  She notes that she finds that her abnormal movements may come from Zoloft.   No medication changes made.  Patient agreeable to continue medication as prescribed. She will follow up with out patient counseling for therapy.  At patient's next visit provider ordered labs as she has not had them done and about 6 months.  No other concerns noted at this time. Visit Diagnosis:    ICD-10-CM   1. Social anxiety disorder  F40.10 risperiDONE (RISPERDAL) 0.5 MG tablet    sertraline (ZOLOFT) 100 MG tablet    2. Bipolar I disorder (HCC)  F31.9 risperiDONE (RISPERDAL) 0.5 MG tablet        Past Psychiatric History: Depression, SI/SA, Social Anxiety   Past Medical History:  Past Medical History:  Diagnosis Date   Seizure-like activity (HCC)     Past Surgical History:  Procedure Laterality Date   NO PAST SURGERIES      Family Psychiatric History: Brother unknown mental health conditions   Family History:  Family History  Problem Relation Age of Onset   Healthy Mother    Healthy Father     Social History:  Social History   Socioeconomic History   Marital status: Single    Spouse name: Not on file   Number of children: 0   Years of education: 12   Highest education level: High school graduate  Occupational History   Occupation: unemployed  Tobacco Use   Smoking status: Never   Smokeless tobacco: Never  Vaping  Use   Vaping status: Never Used  Substance and Sexual Activity   Alcohol use: Yes   Drug use: Never   Sexual activity: Yes    Birth control/protection: None  Other Topics Concern   Not on file  Social History Narrative   Right-handed.   Occasional caffeine use.   Lives at home with her mother.   Social Drivers of Corporate investment banker Strain: Not on file  Food Insecurity: Not on file  Transportation Needs: Not on file  Physical Activity: Not on file  Stress: Not on file  Social Connections: Not on file     Allergies:  Allergies  Allergen Reactions   Metronidazole Rash    Metabolic Disorder Labs: Lab Results  Component Value Date   HGBA1C 5.2 02/14/2023   MPG 102.54 02/14/2023   MPG 102.54 05/08/2022   No results found for: "PROLACTIN" Lab Results  Component Value Date   CHOL 151 02/14/2023   TRIG 46 02/14/2023   HDL 49 02/14/2023   CHOLHDL 3.1 02/14/2023   VLDL 9 02/14/2023   LDLCALC 93 02/14/2023   LDLCALC 128 (H) 05/08/2022   Lab Results  Component Value Date   TSH 1.398 02/14/2023   TSH 1.361 05/08/2022    Therapeutic Level Labs: No results found for: "LITHIUM" No results found for: "VALPROATE" No results found for: "CBMZ"  Current Medications: Current Outpatient Medications  Medication Sig Dispense Refill   mupirocin ointment (BACTROBAN) 2 % Apply 1 Application topically 2 (two) times daily. 15 g 0   risperiDONE (RISPERDAL) 0.5 MG tablet Take 1 tablet (0.5 mg total) by mouth 2 (two) times daily. 60 tablet 3   sertraline (ZOLOFT) 100 MG tablet Take 1 tablet (100 mg total) by mouth daily. 30 tablet 3   No current facility-administered medications for this visit.     Musculoskeletal: Strength & Muscle Tone: within normal limits and telehealth visit Gait & Station: normal, telehealth visit Patient leans: N/A  Psychiatric Specialty Exam: Review of Systems  unknown if currently breastfeeding.There is no height or weight on file to calculate BMI.  General Appearance: Well Groomed  Eye Contact:  Good  Speech:  Clear and Coherent and Normal Rate  Volume:  Normal  Mood:  Euthymic  Affect:  Appropriate  Thought Process:  Coherent, Goal Directed, and Linear  Orientation:  Full (Time, Place, and Person)  Thought Content: WDL and Logical   Suicidal Thoughts:  No  Homicidal Thoughts:  No  Memory:  Immediate;   Good Recent;   Good Remote;   Good  Judgement:  Good  Insight:  Good  Psychomotor Activity:  Normal  Concentration:  Concentration: Good and  Attention Span: Good  Recall:  Good  Fund of Knowledge: Good  Language: Good  Akathisia:  No  Handed:  Right  AIMS (if indicated): not done  Assets:  Communication Skills Desire for Improvement Financial Resources/Insurance Leisure Time Physical Health Social Support  ADL's:  Intact  Cognition: WNL  Sleep:  Good   Screenings: AIMS    Flowsheet Row [facility]  AIMS Total Score 0      GAD-7    Flowsheet Row Video Visit from 10/01/2023 in [facility] Video Visit from 07/23/2023 in [facility] Video Visit from 05/22/2023 in [facility] Office Visit from 03/16/2023 in [facility] Office Visit from 12/31/2022 in [facility] Of [facility]  Total GAD-7 Score 4 6 12 16 11       PHQ2-9    Flowsheet Row Video Visit from 10/01/2023 in [facility] Video Visit from 07/23/2023 in [facility] Video Visit from 05/22/2023 in [facility] Office Visit from 03/16/2023 in [facility] Office Visit from 12/31/2022 in [facility]  PHQ-2 Total Score 1 0 2 4 0  PHQ-9 Total Score 6 2 7 17 10       Flowsheet Row Office Visit from 03/16/2023 in [facility] ED from 02/28/2023 in [facility] ED from 02/12/2023 in [facility] Emergency Department at [facility]  C-SSRS RISK CATEGORY Error: Q3, 4, or 5 should not be populated when Q2 is No Error: Q3, 4, or 5 should not be populated when Q2 is No High Risk        Assessment and Plan: Patient reports that she is doing well on her current medication regimen. No medication changes made.  Patient  agreeable to continue medication as prescribed. She will follow up with out patient counseling for therapy.  At patient's next visit provider ordered labs as she has not had them done and about 6 months.  1. Social anxiety disorder  Continue- sertraline (ZOLOFT) 100 MG tablet; Take 1 tablet (100 mg total) by mouth daily.  Dispense: 30 tablet; Refill: 3 Continue- risperiDONE (RISPERDAL) 0.5 MG tablet; Take 1 tablet (0.5 mg total) by mouth 2 (two) times daily.  Dispense: 60 tablet; Refill: 3  2. Bipolar I disorder (HCC)  Continue- risperiDONE (RISPERDAL) 0.5 MG tablet; Take 1 tablet (0.5 mg total) by mouth 2 (two) times daily.  Dispense: 60 tablet; Refill: 3  Collaboration of Care: Collaboration of Care: Other provider involved in patient's care AEB PCP  Patient/Guardian was advised Release of Information must be obtained prior to any record release in order to collaborate their care with an outside provider. Patient/Guardian was advised if they have not already done so to contact the registration department to sign all necessary forms in order for [place] to release information regarding their care.   Consent: Patient/Guardian gives verbal consent for treatment and assignment of benefits for services provided during this visit. Patient/Guardian expressed understanding and agreed to proceed.   Follow-up in 2.5 months   Shanna Cisco, NP 10/01/2023, 4:14 PM

## 2023-11-03 ENCOUNTER — Telehealth (HOSPITAL_COMMUNITY): Payer: Self-pay | Admitting: *Deleted

## 2023-11-03 NOTE — Telephone Encounter (Signed)
 Fax received for PA of Risperidone 0.5mg . Submitted online with cover my meds. Awaiting results.

## 2023-11-03 NOTE — Telephone Encounter (Signed)
 Fax received for approval of Risperidone 0.5mg  until 11/02/24. Called to notify pharmacy.

## 2023-12-04 ENCOUNTER — Ambulatory Visit (HOSPITAL_COMMUNITY)
Admission: EM | Admit: 2023-12-04 | Discharge: 2023-12-04 | Disposition: A | Discharge status: Home / Self Care | Payer: MEDICAID | Attending: Physician Assistant | Admitting: Physician Assistant

## 2023-12-04 ENCOUNTER — Encounter (HOSPITAL_COMMUNITY): Payer: Self-pay

## 2023-12-04 DIAGNOSIS — N3001 Acute cystitis with hematuria: Secondary | ICD-10-CM | POA: Insufficient documentation

## 2023-12-04 DIAGNOSIS — Z3201 Encounter for pregnancy test, result positive: Secondary | ICD-10-CM | POA: Diagnosis present

## 2023-12-04 DIAGNOSIS — R300 Dysuria: Secondary | ICD-10-CM | POA: Insufficient documentation

## 2023-12-04 LAB — POCT URINALYSIS DIP (MANUAL ENTRY)
Bilirubin, UA: NEGATIVE
Glucose, UA: NEGATIVE mg/dL
Nitrite, UA: NEGATIVE
Protein Ur, POC: 100 mg/dL — AB
Spec Grav, UA: 1.025 (ref 1.010–1.025)
Urobilinogen, UA: 1 U/dL
pH, UA: 7 (ref 5.0–8.0)

## 2023-12-04 LAB — POCT URINE PREGNANCY: Preg Test, Ur: POSITIVE — AB

## 2023-12-04 MED ORDER — CEPHALEXIN 500 MG PO CAPS: 500.0000 mg | ORAL_CAPSULE | Freq: Four times a day (QID) | ORAL | 0 refills | Status: AC

## 2023-12-04 NOTE — ED Triage Notes (Signed)
 Pt states that she has some pain with urination and blood in her urine. X1 day

## 2023-12-04 NOTE — Discharge Instructions (Signed)
 Based on your symptoms and results of the urinalysis I believe you have a UTI I recommend the following:  I have sent in a script for Keflex to be taken by mouth every 6 hours for 5 days   Please finish the entire course of the antibiotic even if you are feeling better before it is completed unless a medical provider tells you to stop taking it or you develop an allergic reaction  Stay well hydrated (at least 75 oz of water per day) and avoid holding your urine If you have any of the following please let us know: persistent symptoms, fever, trouble urinating or inability to urinate, confusion, flank pain.   Please make sure that you start a prenatal vitamin to help prevent neural tube defects and to make sure that you are getting adequate vitamins and minerals during pregnancy. Please make sure that you call and establish with an OB/GYN in the next few weeks to make sure that you are appropriately evaluated your pregnancy is monitored throughout the entire course.

## 2023-12-04 NOTE — ED Provider Notes (Signed)
 [facility]    CSN: [id number] Arrival date & time: 12/04/23  1652      History   Chief Complaint Chief Complaint  Patient presents with   Dysuria    Pain with urination and blood in urine x1 day    HPI Jenny Chan is a 28 y.o. female.   HPI   She reports she is having some discomfort with urination that started this AM  She reports some burning along with urinary hesitancy  She denies fevers, chills   LMP: she is not completely sure but thinks it was last month  She reports she did take a urine pregnancy test at home and it was positive     Past Medical History:  Diagnosis Date   Seizure-like activity 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   Social anxiety disorder 02/14/2023   Current episode of major depressive disorder without prior episode 02/13/2023   History of seizures 12/31/2022   SVD (spontaneous vaginal delivery) 10/18/2018   Post term pregnancy at [redacted] weeks gestation 10/16/2018   Observed seizure-like activity (HCC) 07/26/2018   Severe episode of recurrent major depressive disorder, without psychotic features (HCC)     Past Surgical History:  Procedure Laterality Date   NO PAST SURGERIES      OB History     Gravida  1   Para  1   Term  1   Preterm      AB      Living  1      SAB      IAB      Ectopic      Multiple  0   Live Births  1            Home Medications    Prior to Admission medications   Medication Sig Start Date End Date Taking? Authorizing Provider  cephALEXin (KEFLEX) 500 MG capsule Take 1 capsule (500 mg total) by mouth 4 (four) times daily for 5 days. 12/04/23 12/09/23 Yes Kivon Aprea E, PA-C  mupirocin ointment (BACTROBAN) 2 % Apply 1 Application topically 2 (two) times daily. 02/28/23  Yes Rising, Lurena Joiner, PA-C  risperiDONE (RISPERDAL) 0.5 MG tablet Take 1 tablet (0.5 mg total) by mouth  2 (two) times daily. 10/01/23  Yes Toy Cookey E, NP  sertraline (ZOLOFT) 100 MG tablet Take 1 tablet (100 mg total) by mouth daily. 10/01/23  Yes Shanna Cisco, NP    Family History Family History  Problem Relation Age of Onset   Healthy Mother    Healthy Father     Social History Social History   Tobacco Use   Smoking status: Never   Smokeless tobacco: Never  Vaping Use   Vaping status: Never Used  Substance Use Topics   Alcohol use: Not Currently   Drug use: Never     Allergies   Metronidazole   Review of Systems Review of Systems  Constitutional:  Negative for chills and fever.  Gastrointestinal:  Positive for abdominal pain (around navel).  Genitourinary:  Positive for dysuria and hematuria. Negative for difficulty urinating, vaginal bleeding, vaginal discharge and vaginal pain.     Physical Exam Triage Vital Signs ED Triage Vitals  Encounter Vitals Group     BP 12/04/23 1731 112/72     Systolic BP Percentile --      Diastolic BP Percentile --  Pulse Rate 12/04/23 1731 97     Resp 12/04/23 1731 18     Temp 12/04/23 1731 98.8 F (37.1 C)     Temp Source 12/04/23 1731 Oral     SpO2 12/04/23 1731 98 %     Weight --      Height 12/04/23 1730 5\' 1"  (1.549 m)     Head Circumference --      Peak Flow --      Pain Score 12/04/23 1730 10     Pain Loc --      Pain Education --      Exclude from Growth Chart --    No data found.  Updated Vital Signs BP 112/72 (BP Location: Left Arm)   Pulse 97   Temp 98.8 F (37.1 C) (Oral)   Resp 18   Ht 5\' 1"  (1.549 m)   LMP  (LMP Unknown)   SpO2 98%   Breastfeeding No   BMI 24.79 kg/m   Visual Acuity Right Eye Distance:   Left Eye Distance:   Bilateral Distance:    Right Eye Near:   Left Eye Near:    Bilateral Near:     Physical Exam Vitals reviewed.  Constitutional:      General: She is awake.     Appearance: Normal appearance. She is well-developed and well-groomed.  HENT:     Head:  Normocephalic and atraumatic.  Eyes:     General: Lids are normal. Gaze aligned appropriately.     Extraocular Movements: Extraocular movements intact.     Conjunctiva/sclera: Conjunctivae normal.  Pulmonary:     Effort: Pulmonary effort is normal.  Neurological:     General: No focal deficit present.     Mental Status: She is alert and oriented to person, place, and time.     GCS: GCS eye subscore is 4. GCS verbal subscore is 5. GCS motor subscore is 6.     Cranial Nerves: No cranial nerve deficit, dysarthria or facial asymmetry.  Psychiatric:        Attention and Perception: Attention and perception normal.        Mood and Affect: Mood and affect normal.        Speech: Speech normal.        Behavior: Behavior normal. Behavior is cooperative.      UC Treatments / Results  Labs (all labs ordered are listed, but only abnormal results are displayed) Labs Reviewed  POCT URINALYSIS DIP (MANUAL ENTRY) - Abnormal; Notable for the following components:      Result Value   Clarity, UA cloudy (*)    Ketones, POC UA small (15) (*)    Blood, UA large (*)    Protein Ur, POC =100 (*)    Leukocytes, UA Small (1+) (*)    All other components within normal limits  POCT URINE PREGNANCY - Abnormal; Notable for the following components:   Preg Test, Ur Positive (*)    All other components within normal limits  URINE CULTURE    EKG   Radiology No results found.  Procedures Procedures (including critical care time)  Medications Ordered in UC Medications - No data to display  Initial Impression / Assessment and Plan / UC Course  I have reviewed the triage vital signs and the nursing notes.  Pertinent labs & imaging results that were available during my care of the patient were reviewed by me and considered in my medical decision making (see chart for details).  Final Clinical Impressions(s) / UC Diagnoses   Final diagnoses:  Dysuria  Acute cystitis with hematuria   Pregnancy test positive   Patient presents today with concerns for UTI.  She reports that she has been having dysuria that started this morning.  Urine dip was notable for leukocytes as well as red blood cells.  Urine pregnancy test was positive.  These results were discussed with patient during her appointment.  She reports that she did take a urine pregnancy test at home which was positive.  She is not taking a prenatal vitamin at this time.  Reviewed importance of starting a prenatal vitamin if she wishes to remain pregnant.  Reviewed that pregnancy termination in [place] is limited to 12 to 20 weeks under special circumstances and she needs to establish with an OB/GYN for ongoing Pregnancy monitoring and management. Urine dip appeared consistent with UTI so we will start Keflex 500 mg p.o. 4 times daily x 5 days per up-to-date protocol for pertinent individuals.  Recommend Tylenol as needed for pain management.  ED return precautions reviewed and provided in after visit summary.  Follow-up as needed.   Discharge Instructions      Based on your symptoms and results of the urinalysis I believe you have a UTI I recommend the following:  I have sent in a script for Keflex to be taken by mouth every 6 hours for 5 days   Please finish the entire course of the antibiotic even if you are feeling better before it is completed unless a medical provider tells you to stop taking it or you develop an allergic reaction  Stay well hydrated (at least 75 oz of water per day) and avoid holding your urine If you have any of the following please let us know: persistent symptoms, fever, trouble urinating or inability to urinate, confusion, flank pain.   Please make sure that you start a prenatal vitamin to help prevent neural tube defects and to make sure that you are getting adequate vitamins and minerals during pregnancy. Please make sure that you call and establish with an OB/GYN in the next few weeks  to make sure that you are appropriately evaluated your pregnancy is monitored throughout the entire course.     ED Prescriptions     Medication Sig Dispense Auth. Provider   cephALEXin (KEFLEX) 500 MG capsule Take 1 capsule (500 mg total) by mouth 4 (four) times daily for 5 days. 20 capsule Altamese Deguire E, PA-C      PDMP not reviewed this encounter.   Providence Crosby, PA-C 12/04/23 1610

## 2023-12-06 LAB — URINE CULTURE: Culture: >=100000 — AB

## 2023-12-16 ENCOUNTER — Telehealth (HOSPITAL_COMMUNITY): Payer: MEDICAID | Admitting: Psychiatry

## 2023-12-16 ENCOUNTER — Encounter (HOSPITAL_COMMUNITY): Payer: Self-pay | Admitting: Psychiatry

## 2023-12-16 DIAGNOSIS — F4010 Social phobia, unspecified: Secondary | ICD-10-CM

## 2023-12-16 DIAGNOSIS — F319 Bipolar disorder, unspecified: Secondary | ICD-10-CM

## 2023-12-16 MED ORDER — SERTRALINE HCL 100 MG PO TABS
100.0000 mg | ORAL_TABLET | Freq: Every day | ORAL | 3 refills | Status: DC
Start: 2023-12-16 — End: 2024-02-10

## 2023-12-16 MED ORDER — RISPERIDONE 0.5 MG PO TABS: 0.5000 mg | ORAL_TABLET | Freq: Two times a day (BID) | ORAL | 3 refills | Status: DC

## 2023-12-16 NOTE — Progress Notes (Signed)
 [facility] MD/PA/NP OP Progress Note Virtual Visit via Video Note  I connected with Jenny Chan on 12/16/23 at  4:00 PM EDT by a video enabled telemedicine application and verified that I am speaking with the correct person using two identifiers.  Location: Patient: Work Provider: Clinic   I discussed the limitations of evaluation and management by telemedicine and the availability of in person appointments. The patient expressed understanding and agreed to proceed.  I provided 30 minutes of non-face-to-face time during this encounter.   12/16/2023 4:19 PM Jenny Chan  MRN:  [id number]  Chief Complaint: "I okay"  HPI:  28 year old female seen today for follow-up psychiatric evaluation.  She has a psychiatric history of social anxiety, depression, bipolar disorder, SA/SI.  She is currently managed on Zoloft 100 mg daily and Risperdal 0.5 mg twice daily.  She notes that her medications are effective in managing her psychiatric conditions.     Today she was well-groomed, pleasant, cooperative, and engaged in conversation.  She informed Clinical research associate that her she has been okay.  She notes that she is just getting up from work and heading home.  She continues to find enjoyment at her job.  Patient works at [employer].  Since her last visit she notes that her mood is stable and notes that her anxiety and depression are well-managed.    Today provider conducted a GAD-7 and patient scored a 2, at her last visit she scored a 4.  Provider also conducted PHQ-9 and patient scored a 1, at her last visit she scored a 6.  She endorsed adequate sleep and appetite.  Today she denies SI/HI/VAH, mania, paranoia.  Patient reports that she is no longer having abnormal muscle movements.  Provider informed patient that if this occurs again to inform her.  She will need to be assessed in person if it does occur again.   Overall she notes that she is doing well.  No medication changes made.  Patient agreeable to continue  medication as prescribed. She will follow up with out patient counseling for therapy.  Patient has not had labs done in over 6 months.  Today provider ordered CBC, CMP, LFT, lipid panel, thyroid panel, prolactin level, and HgbA1c.  No other concerns noted at this time. Visit Diagnosis:    ICD-10-CM   1. Social anxiety disorder  F40.10 risperiDONE (RISPERDAL) 0.5 MG tablet    sertraline (ZOLOFT) 100 MG tablet    2. Bipolar I disorder (HCC)  F31.9 risperiDONE (RISPERDAL) 0.5 MG tablet    CBC    Comprehensive Metabolic Panel (CMET)    Hepatic function panel    HgB A1c    Lipid panel    Thyroid Panel With TSH    Prolactin         Past Psychiatric History: Depression, SI/SA, Social Anxiety   Past Medical History:  Past Medical History:  Diagnosis Date   Seizure-like activity (HCC)     Past Surgical History:  Procedure Laterality Date   NO PAST SURGERIES      Family Psychiatric History: Brother unknown mental health conditions   Family History:  Family History  Problem Relation Age of Onset   Healthy Mother    Healthy Father     Social History:  Social History   Socioeconomic History   Marital status: Single    Spouse name: Not on file   Number of children: 0   Years of education: 12   Highest education level: High school graduate  Occupational History  Occupation: unemployed  Tobacco Use   Smoking status: Never   Smokeless tobacco: Never  Vaping Use   Vaping status: Never Used  Substance and Sexual Activity   Alcohol use: Not Currently   Drug use: Never   Sexual activity: Yes    Birth control/protection: None  Other Topics Concern   Not on file  Social History Narrative   Right-handed.   Occasional caffeine use.   Lives at home with her mother.   Social Drivers of Corporate investment banker Strain: Not on file  Food Insecurity: Not on file  Transportation Needs: Not on file  Physical Activity: Not on file  Stress: Not on file  Social  Connections: Not on file    Allergies:  Allergies  Allergen Reactions   Metronidazole Rash    Metabolic Disorder Labs: Lab Results  Component Value Date   HGBA1C 5.2 02/14/2023   MPG 102.54 02/14/2023   MPG 102.54 05/08/2022   No results found for: "PROLACTIN" Lab Results  Component Value Date   CHOL 151 02/14/2023   TRIG 46 02/14/2023   HDL 49 02/14/2023   CHOLHDL 3.1 02/14/2023   VLDL 9 02/14/2023   LDLCALC 93 02/14/2023   LDLCALC 128 (H) 05/08/2022   Lab Results  Component Value Date   TSH 1.398 02/14/2023   TSH 1.361 05/08/2022    Therapeutic Level Labs: No results found for: "LITHIUM" No results found for: "VALPROATE" No results found for: "CBMZ"  Current Medications: Current Outpatient Medications  Medication Sig Dispense Refill   mupirocin ointment (BACTROBAN) 2 % Apply 1 Application topically 2 (two) times daily. 15 g 0   risperiDONE (RISPERDAL) 0.5 MG tablet Take 1 tablet (0.5 mg total) by mouth 2 (two) times daily. 60 tablet 3   sertraline (ZOLOFT) 100 MG tablet Take 1 tablet (100 mg total) by mouth daily. 30 tablet 3   No current facility-administered medications for this visit.     Musculoskeletal: Strength & Muscle Tone: within normal limits and telehealth visit Gait & Station: normal, telehealth visit Patient leans: N/A  Psychiatric Specialty Exam: Review of Systems  There were no vitals taken for this visit.There is no height or weight on file to calculate BMI.  General Appearance: Well Groomed  Eye Contact:  Good  Speech:  Clear and Coherent and Normal Rate  Volume:  Normal  Mood:  Euthymic  Affect:  Appropriate  Thought Process:  Coherent, Goal Directed, and Linear  Orientation:  Full (Time, Place, and Person)  Thought Content: WDL and Logical   Suicidal Thoughts:  No  Homicidal Thoughts:  No  Memory:  Immediate;   Good Recent;   Good Remote;   Good  Judgement:  Good  Insight:  Good  Psychomotor Activity:  Normal   Concentration:  Concentration: Good and Attention Span: Good  Recall:  Good  Fund of Knowledge: Good  Language: Good  Akathisia:  No  Handed:  Right  AIMS (if indicated): not done  Assets:  Communication Skills Desire for Improvement Financial Resources/Insurance Leisure Time Physical Health Social Support  ADL's:  Intact  Cognition: WNL  Sleep:  Good   Screenings: AIMS    Flowsheet Row [facility]  AIMS Total Score 0      GAD-7    Flowsheet Row Video Visit from 12/16/2023 in [facility] Video Visit from 10/01/2023 in [facility] Video Visit from 07/23/2023 in [facility] Video Visit from 05/22/2023 in [facility] Office Visit from 03/16/2023 in [facility]  Total GAD-7 Score 2 4 6 12 16       PHQ2-9    Flowsheet Row Video Visit from 12/16/2023 in [facility] Video Visit from 10/01/2023 in [facility] Video Visit from 07/23/2023 in [facility] Video Visit from 05/22/2023 in [facility] Office Visit from 03/16/2023 in [facility]  PHQ-2 Total Score 1 1 0 2 4  PHQ-9 Total Score 1 6 2 7 17       Flowsheet Row Video Visit from 12/16/2023 in [facility] ED from 12/04/2023 in [facility] Visit from 03/16/2023 in [facility]  C-SSRS RISK CATEGORY No Risk No Risk Error: Q3, 4, or 5 should not be populated when Q2 is No        Assessment and Plan: Overall she notes that she is doing well.  No medication changes made.  Patient agreeable to continue medication as prescribed. She will follow up with out patient counseling for therapy.  Patient  has not had labs done in over 6 months.  Today provider ordered CBC, CMP, LFT, lipid panel, thyroid panel, prolactin level, and HgbA1c.    1. Social anxiety disorder  Continue- risperiDONE (RISPERDAL) 0.5 MG tablet; Take 1 tablet (0.5 mg total) by mouth 2 (two) times daily.  Dispense: 60 tablet; Refill: 3 Continue- sertraline (ZOLOFT) 100 MG tablet; Take 1 tablet (100 mg total) by mouth daily.  Dispense: 30 tablet; Refill: 3  2. Bipolar I disorder (HCC)  Continue- risperiDONE (RISPERDAL) 0.5 MG tablet; Take 1 tablet (0.5 mg total) by mouth 2 (two) times daily.  Dispense: 60 tablet; Refill: 3 - CBC; Future - Comprehensive Metabolic Panel (CMET); Future - Hepatic function panel; Future - HgB A1c; Future - Lipid panel; Future - Thyroid Panel With TSH; Future - Prolactin; Future - Prolactin - Thyroid Panel With TSH - Lipid panel - HgB A1c - Hepatic function panel - Comprehensive Metabolic Panel (CMET) - CBC  Collaboration of Care: Collaboration of Care: Other provider involved in patient's care AEB PCP  Patient/Guardian was advised Release of Information must be obtained prior to any record release in order to collaborate their care with an outside provider. Patient/Guardian was advised if they have not already done so to contact the registration department to sign all necessary forms in order for us  to release information regarding their care.   Consent: Patient/Guardian gives verbal consent for treatment and assignment of benefits for services provided during this visit. Patient/Guardian expressed understanding and agreed to proceed.   Follow-up in 2.5 months   Arlyne Bering, NP 12/16/2023, 4:19 PM

## 2024-02-10 ENCOUNTER — Encounter (HOSPITAL_COMMUNITY): Payer: Self-pay | Admitting: Psychiatry

## 2024-02-10 ENCOUNTER — Telehealth (HOSPITAL_COMMUNITY): Payer: MEDICAID | Admitting: Psychiatry

## 2024-02-10 DIAGNOSIS — F319 Bipolar disorder, unspecified: Secondary | ICD-10-CM | POA: Diagnosis not present

## 2024-02-10 DIAGNOSIS — F4010 Social phobia, unspecified: Secondary | ICD-10-CM | POA: Diagnosis not present

## 2024-02-10 MED ORDER — RISPERIDONE 0.5 MG PO TABS
0.5000 mg | ORAL_TABLET | Freq: Two times a day (BID) | ORAL | 3 refills | Status: AC
Start: 2024-02-10 — End: ?

## 2024-02-10 MED ORDER — SERTRALINE HCL 100 MG PO TABS
100.0000 mg | ORAL_TABLET | Freq: Every day | ORAL | 3 refills | Status: DC
Start: 2024-02-10 — End: 2024-07-01

## 2024-02-10 NOTE — Progress Notes (Signed)
 BH MD/PA/NP OP Progress Note Virtual Visit via Video Note  I connected with Jenny Chan on 02/10/24 at  4:00 PM EDT by a video enabled telemedicine application and verified that I am speaking with the correct person using two identifiers.  Location: Patient: Work Provider: Clinic   I discussed the limitations of evaluation and management by telemedicine and the availability of in person appointments. The patient expressed understanding and agreed to proceed.  I provided 30 minutes of non-face-to-face time during this encounter.   02/10/2024 4:16 PM Jenny Chan  MRN:  [id number]  Chief Complaint: I think I may be pregnant  HPI:  28 year old female seen today for follow-up psychiatric evaluation.  She has a psychiatric history of social anxiety, depression, bipolar disorder, SA/SI.  She is currently managed on Zoloft  100 mg daily and Risperdal  0.5 mg twice daily.  She notes that her medications are effective in managing her psychiatric conditions.     Today she was well-groomed, pleasant, cooperative, and engaged in conversation.  She informed Clinical research associate that she thinks that she may be pregnant.  She notes that she took 2 pregnancy test 1 which was positive and 1 which was negative.  Patient given phone number to [facility] and encouraged her to set up a appointment to confirm her pregnancy.  She endorsed understanding and agreed.    Since her last visit she notes that her mood, anxiety, depression continues to be well-managed.    Today provider conducted a GAD-7 and patient scored a 0, at her last visit she scored a 2.  Provider also conducted PHQ-9 and patient scored a 4, at her last visit she scored a 1.  She endorsed adequate sleep and appetite.  Today she denies SI/HI/VAH, mania, paranoia.   Overall she notes that she is doing well.  No medication changes made.  Patient agreeable to continue medication as prescribed. She will follow up with out patient counseling for  therapy.   No other concerns noted at this time. Visit Diagnosis:    ICD-10-CM   1. Social anxiety disorder  F40.10 risperiDONE  (RISPERDAL ) 0.5 MG tablet    sertraline  (ZOLOFT ) 100 MG tablet    2. Bipolar I disorder (HCC)  F31.9 risperiDONE  (RISPERDAL ) 0.5 MG tablet          Past Psychiatric History: Depression, SI/SA, Social Anxiety   Past Medical History:  Past Medical History:  Diagnosis Date   Seizure-like activity (HCC)     Past Surgical History:  Procedure Laterality Date   NO PAST SURGERIES      Family Psychiatric History: Brother unknown mental health conditions   Family History:  Family History  Problem Relation Age of Onset   Healthy Mother    Healthy Father     Social History:  Social History   Socioeconomic History   Marital status: Single    Spouse name: Not on file   Number of children: 0   Years of education: 12   Highest education level: High school graduate  Occupational History   Occupation: unemployed  Tobacco Use   Smoking status: Never   Smokeless tobacco: Never  Vaping Use   Vaping status: Never Used  Substance and Sexual Activity   Alcohol use: Not Currently   Drug use: Never   Sexual activity: Yes    Birth control/protection: None  Other Topics Concern   Not on file  Social History Narrative   Right-handed.   Occasional caffeine use.   Lives at home with  her mother.   Social Drivers of Corporate investment banker Strain: Not on file  Food Insecurity: Not on file  Transportation Needs: Not on file  Physical Activity: Not on file  Stress: Not on file  Social Connections: Not on file    Allergies:  Allergies  Allergen Reactions   Metronidazole  Rash    Metabolic Disorder Labs: Lab Results  Component Value Date   HGBA1C 5.2 02/14/2023   MPG 102.54 02/14/2023   MPG 102.54 05/08/2022   No results found for: PROLACTIN Lab Results  Component Value Date   CHOL 151 02/14/2023   TRIG 46 02/14/2023   HDL 49  02/14/2023   CHOLHDL 3.1 02/14/2023   VLDL 9 02/14/2023   LDLCALC 93 02/14/2023   LDLCALC 128 (H) 05/08/2022   Lab Results  Component Value Date   TSH 1.398 02/14/2023   TSH 1.361 05/08/2022    Therapeutic Level Labs: No results found for: LITHIUM No results found for: VALPROATE No results found for: CBMZ  Current Medications: Current Outpatient Medications  Medication Sig Dispense Refill   mupirocin  ointment (BACTROBAN ) 2 % Apply 1 Application topically 2 (two) times daily. 15 g 0   risperiDONE  (RISPERDAL ) 0.5 MG tablet Take 1 tablet (0.5 mg total) by mouth 2 (two) times daily. 60 tablet 3   sertraline  (ZOLOFT ) 100 MG tablet Take 1 tablet (100 mg total) by mouth daily. 30 tablet 3   No current facility-administered medications for this visit.     Musculoskeletal: Strength & Muscle Tone: within normal limits and telehealth visit Gait & Station: normal, telehealth visit Patient leans: N/A  Psychiatric Specialty Exam: Review of Systems  There were no vitals taken for this visit.There is no height or weight on file to calculate BMI.  General Appearance: Well Groomed  Eye Contact:  Good  Speech:  Clear and Coherent and Normal Rate  Volume:  Normal  Mood:  Euthymic  Affect:  Appropriate  Thought Process:  Coherent, Goal Directed, and Linear  Orientation:  Full (Time, Place, and Person)  Thought Content: WDL and Logical   Suicidal Thoughts:  No  Homicidal Thoughts:  No  Memory:  Immediate;   Good Recent;   Good Remote;   Good  Judgement:  Good  Insight:  Good  Psychomotor Activity:  Normal  Concentration:  Concentration: Good and Attention Span: Good  Recall:  Good  Fund of Knowledge: Good  Language: Good  Akathisia:  No  Handed:  Right  AIMS (if indicated): not done  Assets:  Communication Skills Desire for Improvement Financial Resources/Insurance Leisure Time Physical Health Social Support  ADL's:  Intact  Cognition: WNL  Sleep:  Good    Screenings: AIMS    Flowsheet Row [facility]  AIMS Total Score 0      GAD-7    Flowsheet Row Video Visit from 02/10/2024 in [facility] Video Visit from 12/16/2023 in [facility] Video Visit from 10/01/2023 in [facility] Video Visit from 07/23/2023 in [facility] Video Visit from 05/22/2023 in [facility]  Total GAD-7 Score 0 2 4 6 12       PHQ2-9    Flowsheet Row Video Visit from 02/10/2024 in [facility] Video Visit from 12/16/2023 in [facility] Video Visit from 10/01/2023 in [facility] Video Visit from 07/23/2023 in  [facility] Video Visit from 05/22/2023 in [facility]  PHQ-2 Total Score 0 1 1 0 2  PHQ-9 Total Score 4 1 6 2 7       Flowsheet Row Video Visit from 02/10/2024 in [facility] Video Visit from 12/16/2023 in [facility] UC from 12/04/2023 in [facility] RISK CATEGORY No Risk No Risk No Risk        Assessment and Plan: Overall she notes that she is doing well.  She does note that she may be pregnant.  Patient given resources to [employer] for women in [place].  At this time no medication changes made.  Patient agreeable to continue medication as prescribed.  Patient informed that medication adjustments can be discussed pending her pregnancy results.  1. Social anxiety disorder  Continue- risperiDONE  (RISPERDAL ) 0.5 MG tablet; Take 1 tablet (0.5 mg total) by mouth 2 (two) times daily.  Dispense: 60 tablet; Refill: 3 Continue- sertraline  (ZOLOFT ) 100 MG tablet; Take 1 tablet (100 mg total) by mouth daily.  Dispense: 30  tablet; Refill: 3  2. Bipolar I disorder (HCC)  Continue- risperiDONE  (RISPERDAL ) 0.5 MG tablet; Take 1 tablet (0.5 mg total) by mouth 2 (two) times daily.  Dispense: 60 tablet; Refill: 3   Collaboration of Care: Collaboration of Care: Other provider involved in patient's care AEB PCP  Patient/Guardian was advised Release of Information must be obtained prior to any record release in order to collaborate their care with an outside provider. Patient/Guardian was advised if they have not already done so to contact the registration department to sign all necessary forms in order for us  to release information regarding their care.   Consent: Patient/Guardian gives verbal consent for treatment and assignment of benefits for services provided during this visit. Patient/Guardian expressed understanding and agreed to proceed.   Follow-up in 2.5 months   Arlyne Bering, NP 02/10/2024, 4:16 PM

## 2024-03-29 ENCOUNTER — Encounter (HOSPITAL_COMMUNITY): Payer: MEDICAID | Admitting: Physician Assistant

## 2024-05-06 ENCOUNTER — Telehealth (HOSPITAL_COMMUNITY): Payer: MEDICAID | Admitting: Physician Assistant

## 2024-07-01 ENCOUNTER — Other Ambulatory Visit (HOSPITAL_COMMUNITY): Payer: Self-pay | Admitting: Psychiatry

## 2024-07-01 DIAGNOSIS — F4010 Social phobia, unspecified: Secondary | ICD-10-CM

## 2024-09-18 ENCOUNTER — Encounter (HOSPITAL_COMMUNITY): Payer: Self-pay

## 2024-09-18 ENCOUNTER — Ambulatory Visit (HOSPITAL_COMMUNITY)
Admission: EM | Admit: 2024-09-18 | Discharge: 2024-09-18 | Disposition: A | Discharge status: Home / Self Care | Payer: MEDICAID | Attending: Nurse Practitioner | Admitting: Nurse Practitioner

## 2024-09-18 DIAGNOSIS — N760 Acute vaginitis: Secondary | ICD-10-CM | POA: Insufficient documentation

## 2024-09-18 DIAGNOSIS — N898 Other specified noninflammatory disorders of vagina: Secondary | ICD-10-CM | POA: Insufficient documentation

## 2024-09-18 LAB — POCT URINE PREGNANCY: Preg Test, Ur: NEGATIVE

## 2024-09-18 MED ORDER — CLINDAMYCIN PHOSPHATE 2 % VA CREA: 1.0000 | TOPICAL_CREAM | Freq: Every day | VAGINAL | 0 refills | Status: AC

## 2024-09-18 MED ORDER — FLUCONAZOLE 150 MG PO TABS: 150.0000 mg | ORAL_TABLET | ORAL | 0 refills | Status: AC

## 2024-09-18 NOTE — ED Provider Notes (Signed)
 " [facility]    CSN: [id number] Arrival date & time: 09/18/24  9166      History   Chief Complaint Chief Complaint  Patient presents with   Vaginal Discharge    HPI Jenny Chan is a 29 y.o. female.   Discussed the use of AI scribe software for clinical note transcription with the patient, who gave verbal consent to proceed.   Jenny Chan presents with vaginal discharge that has been ongoing for 3 days. The discharge is described as light to yellow in color and thin in consistency. She also reports some odor. She reports no vaginal itching or irritation. She denies dysuria, abdominal pain, lower back pain, nausea, vomiting, or fevers. She is sexually active with one female partner and does not use condoms consistently. She is not on birth control and reports having an allergy to metronidazole .  The following sections of the patient's history were reviewed and updated as appropriate: allergies, current medications, past family history, past medical history, past social history, past surgical history, and problem list.     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   Social anxiety disorder 02/14/2023   Current episode of major depressive disorder without prior episode 02/13/2023   History of seizures 12/31/2022   SVD (spontaneous vaginal delivery) 10/18/2018   Post term pregnancy at [redacted] weeks gestation 10/16/2018   Observed seizure-like activity (HCC) 07/26/2018   Severe episode of recurrent major depressive disorder, without psychotic features (HCC)     Past Surgical History:  Procedure Laterality Date   NO PAST SURGERIES      OB History     Gravida  1   Para  1   Term  1   Preterm      AB      Living  1      SAB      IAB      Ectopic      Multiple  0   Live Births  1             Home Medications    Prior to Admission medications  Medication Sig Start Date End Date Taking? Authorizing Provider  clindamycin  (CLEOCIN ) 2 % vaginal cream Place 1 Applicatorful vaginally at bedtime for 7 days. 09/18/24 09/25/24 Yes Iola Lukes, FNP  fluconazole  (DIFLUCAN ) 150 MG tablet Take 1 tablet (150 mg total) by mouth every 3 (three) days for 2 doses. 09/18/24 09/22/24 Yes Iola Lukes, FNP  mupirocin  ointment (BACTROBAN ) 2 % Apply 1 Application topically 2 (two) times daily. 02/28/23  Yes Rising, [place]berry, PA-C  risperiDONE  (RISPERDAL ) 0.5 MG tablet Take 1 tablet (0.5 mg total) by mouth 2 (two) times daily. 02/10/24  Yes Harl Regan E, NP  sertraline  (ZOLOFT ) 100 MG tablet TAKE 1 TABLET(100 MG) BY MOUTH DAILY 07/01/24  Yes Harl Regan BRAVO, NP    Family History Family History  Problem Relation Age of Onset   Healthy Mother    Healthy Father     Social History Social History[1]   Allergies   Metronidazole    Review of Systems Review of Systems  Constitutional:  Negative for fever.  Gastrointestinal:  Negative for abdominal pain, diarrhea, nausea and vomiting.  Genitourinary:  Positive for vaginal discharge. Negative for dysuria, menstrual problem (LMP around 08/31/24) and vaginal pain.  Slight odor. No vaginal itching or irritation   Musculoskeletal:  Negative for back pain.  All other systems reviewed and are negative.    Physical Exam Triage Vital Signs ED Triage Vitals [09/18/24 0934]  Encounter Vitals Group     BP 115/78     Girls Systolic BP Percentile      Girls Diastolic BP Percentile      Boys Systolic BP Percentile      Boys Diastolic BP Percentile      Pulse Rate 81     Resp 18     Temp 98.6 F (37 C)     Temp Source Oral     SpO2 98 %     Weight      Height      Head Circumference      Peak Flow      Pain Score      Pain Loc      Pain Education      Exclude from Growth Chart    No data found.  Updated Vital  Signs BP 115/78 (BP Location: Left Arm)   Pulse 81   Temp 98.6 F (37 C) (Oral)   Resp 18   LMP 09/04/2024 (Approximate)   SpO2 98%   Visual Acuity Right Eye Distance:   Left Eye Distance:   Bilateral Distance:    Right Eye Near:   Left Eye Near:    Bilateral Near:     Physical Exam Constitutional:      General: She is not in acute distress.    Appearance: Normal appearance. She is not ill-appearing, toxic-appearing or diaphoretic.  HENT:     Head: Normocephalic.     Nose: Nose normal.     Mouth/Throat:     Mouth: Mucous membranes are moist.  Eyes:     Conjunctiva/sclera: Conjunctivae normal.  Cardiovascular:     Rate and Rhythm: Normal rate.  Pulmonary:     Effort: Pulmonary effort is normal.  Abdominal:     Palpations: Abdomen is soft.  Genitourinary:    Comments: Deferred; patient performed self-swab for Aptima testing  Musculoskeletal:        General: Normal range of motion.     Cervical back: Normal range of motion and neck supple.  Skin:    General: Skin is warm and dry.  Neurological:     General: No focal deficit present.     Mental Status: She is alert and oriented to person, place, and time.  Psychiatric:        Mood and Affect: Mood normal.        Behavior: Behavior normal.      UC Treatments / Results  Labs (all labs ordered are listed, but only abnormal results are displayed) Labs Reviewed  POCT URINE PREGNANCY  CERVICOVAGINAL ANCILLARY ONLY    EKG   Radiology No results found.  Procedures Procedures (including critical care time)  Medications Ordered in UC Medications - No data to display  Initial Impression / Assessment and Plan / UC Course  I have reviewed the triage vital signs and the nursing notes.  Pertinent labs & imaging results that were available during my care of the patient were reviewed by me and considered in my medical decision making (see chart for details).     Patient presents with vaginal discharge and  slight odor. GU exam deferred. Urine pregnancy test was negative. Patient performed self-vaginal swab for gonorrhea, chlamydia, trichomonas, yeast, and bacterial vaginosis testing. Results are currently  pending. Empiric treatment initiated with metronidazole  (Flagyl ) and fluconazole  (Diflucan ). Further management will be guided by pending test results. The patient was advised to maintain adequate hydration and to use barrier protection if sexually active to help reduce the risk of recurrent infection. She was informed that she will be contacted only if results are positive, though all results will be available for review through her MyChart account. She was instructed to follow up with her primary care provider or gynecologist if symptoms do not improve with treatment and to seek medical attention sooner if she develops fever, pelvic pain, or worsening discharge.  Today's evaluation has revealed no signs of a dangerous process. Discussed diagnosis with patient and/or guardian. Patient and/or guardian aware of their diagnosis, possible red flag symptoms to watch out for and need for close follow up. Patient and/or guardian understands verbal and written discharge instructions. Patient and/or guardian comfortable with plan and disposition.  Patient and/or guardian has a clear mental status at this time, good insight into illness (after discussion and teaching) and has clear judgment to make decisions regarding their care  Documentation was completed with the aid of voice recognition software. Transcription may contain typographical errors.   Final Clinical Impressions(s) / UC Diagnoses   Final diagnoses:  Vaginal discharge  Acute vaginitis     Discharge Instructions      You were seen today for vaginal discharge, which can occur when the normal balance of bacteria in the vagina changes. This can be influenced by various factors, including new or multiple sexual partners, unprotected sex, douching,  smoking, certain antibiotics, or pregnancy. Its also possible to develop a vaginal infection even without being sexually active.  Tests were performed today to check for bacteria, yeast, gonorrhea, chlamydia, and trichomonas. While results are pending, treatment has been initiated for the most common non-STD causes--bacterial vaginosis and yeast infection. It is important that you avoid any sexual activity until your test results have returned, your treatment is complete, and your symptoms have fully resolved. You will only be contacted if any of your test results are positive. You can also review your results through your MyChart account.  During this time, avoid douching or using vaginal sprays or deodorants. Wear cotton or cotton-lined underwear to improve airflow and reduce moisture. Be sure to stay hydrated by drinking plenty of fluids.  See your regular doctor or gynecologist if your symptoms do not start to improve with treatment. Go to the emergency room right away if you develop a fever, new or worsening pelvic pain, or if the vaginal discharge gets worse.      ED Prescriptions     Medication Sig Dispense Auth. Provider   clindamycin  (CLEOCIN ) 2 % vaginal cream Place 1 Applicatorful vaginally at bedtime for 7 days. 40 g Alam Guterrez, FNP   fluconazole  (DIFLUCAN ) 150 MG tablet Take 1 tablet (150 mg total) by mouth every 3 (three) days for 2 doses. 2 tablet Iola Lukes, FNP      PDMP not reviewed this encounter.     [1]  Social History Tobacco Use   Smoking status: Never   Smokeless tobacco: Never  Vaping Use   Vaping status: Never Used  Substance Use Topics   Alcohol use: Not Currently   Drug use: Never     Iola Lukes, FNP 09/18/24 1028  "

## 2024-09-18 NOTE — ED Triage Notes (Signed)
 Patient is here for vaginal discharge and odor x 2 days. Patient request STI testing.

## 2024-09-18 NOTE — Discharge Instructions (Addendum)
 You were seen today for vaginal discharge, which can occur when the normal balance of bacteria in the vagina changes. This can be influenced by various factors, including new or multiple sexual partners, unprotected sex, douching, smoking, certain antibiotics, or pregnancy. It's also possible to develop a vaginal infection even without being sexually active. Tests were performed today to check for bacteria, yeast, gonorrhea, chlamydia, and trichomonas. While results are pending, treatment has been initiated for the most common non-STD causes--bacterial vaginosis and yeast infection. It is important that you avoid any sexual activity until your test results have returned, your treatment is complete, and your symptoms have fully resolved. You will only be contacted if any of your test results are positive. You can also review your results through your MyChart account. During this time, avoid douching or using vaginal sprays or deodorants. Wear cotton or cotton-lined underwear to improve airflow and reduce moisture. Be sure to stay hydrated by drinking plenty of fluids. See your regular doctor or gynecologist if your symptoms do not start to improve with treatment. Go to the emergency room right away if you develop a fever, new or worsening pelvic pain, or if the vaginal discharge gets worse.

## 2024-09-20 ENCOUNTER — Ambulatory Visit (HOSPITAL_COMMUNITY): Payer: Self-pay

## 2024-09-20 LAB — CERVICOVAGINAL ANCILLARY ONLY
Bacterial Vaginitis (gardnerella): POSITIVE — AB
Candida Glabrata: NEGATIVE
Candida Vaginitis: NEGATIVE
Chlamydia: NEGATIVE
Comment: NEGATIVE
Comment: NEGATIVE
Comment: NEGATIVE
Comment: NEGATIVE
Comment: NEGATIVE
Comment: NORMAL
Neisseria Gonorrhea: NEGATIVE
Trichomonas: NEGATIVE
